# Patient Record
Sex: Male | Born: 1935
Health system: Southern US, Community
[De-identification: ages and names within clinical notes are randomized; demographics above are authoritative.]

## PROBLEM LIST (undated history)

## (undated) DIAGNOSIS — M4800 Spinal stenosis, site unspecified: Secondary | ICD-10-CM

## (undated) DIAGNOSIS — I071 Rheumatic tricuspid insufficiency: Secondary | ICD-10-CM

## (undated) DIAGNOSIS — F329 Major depressive disorder, single episode, unspecified: Secondary | ICD-10-CM

## (undated) DIAGNOSIS — H9190 Unspecified hearing loss, unspecified ear: Secondary | ICD-10-CM

## (undated) DIAGNOSIS — M1990 Unspecified osteoarthritis, unspecified site: Secondary | ICD-10-CM

## (undated) DIAGNOSIS — I495 Sick sinus syndrome: Secondary | ICD-10-CM

## (undated) DIAGNOSIS — I340 Nonrheumatic mitral (valve) insufficiency: Secondary | ICD-10-CM

## (undated) DIAGNOSIS — F32A Depression, unspecified: Secondary | ICD-10-CM

## (undated) DIAGNOSIS — M545 Low back pain, unspecified: Secondary | ICD-10-CM

## (undated) DIAGNOSIS — C44311 Basal cell carcinoma of skin of nose: Secondary | ICD-10-CM

## (undated) DIAGNOSIS — R42 Dizziness and giddiness: Secondary | ICD-10-CM

## (undated) DIAGNOSIS — I10 Essential (primary) hypertension: Secondary | ICD-10-CM

## (undated) DIAGNOSIS — I519 Heart disease, unspecified: Secondary | ICD-10-CM

## (undated) DIAGNOSIS — I351 Nonrheumatic aortic (valve) insufficiency: Secondary | ICD-10-CM

## (undated) DIAGNOSIS — G43909 Migraine, unspecified, not intractable, without status migrainosus: Secondary | ICD-10-CM

## (undated) DIAGNOSIS — M109 Gout, unspecified: Secondary | ICD-10-CM

## (undated) DIAGNOSIS — I4891 Unspecified atrial fibrillation: Secondary | ICD-10-CM

## (undated) DIAGNOSIS — J329 Chronic sinusitis, unspecified: Secondary | ICD-10-CM

## (undated) HISTORY — DX: Gout, unspecified: M10.9

## (undated) HISTORY — PX: NOSE SURGERY: SHX723

## (undated) HISTORY — PX: INSERT / REPLACE / REMOVE PACEMAKER: SUR710

## (undated) HISTORY — DX: Sick sinus syndrome: I49.5

## (undated) HISTORY — DX: Unspecified osteoarthritis, unspecified site: M19.90

## (undated) HISTORY — DX: Dizziness and giddiness: R42

## (undated) HISTORY — DX: Depression, unspecified: F32.A

## (undated) HISTORY — DX: Nonrheumatic mitral (valve) insufficiency: I34.0

## (undated) HISTORY — DX: Major depressive disorder, single episode, unspecified: F32.9

## (undated) HISTORY — DX: Rheumatic tricuspid insufficiency: I07.1

## (undated) HISTORY — PX: TONSILLECTOMY: SUR1361

## (undated) HISTORY — DX: Basal cell carcinoma of skin of nose: C44.311

## (undated) HISTORY — DX: Heart disease, unspecified: I51.9

## (undated) HISTORY — DX: Spinal stenosis, site unspecified: M48.00

## (undated) HISTORY — DX: Migraine, unspecified, not intractable, without status migrainosus: G43.909

## (undated) HISTORY — PX: NASAL SINUS SURGERY: SHX719

## (undated) HISTORY — DX: Nonrheumatic aortic (valve) insufficiency: I35.1

## (undated) HISTORY — DX: Unspecified hearing loss, unspecified ear: H91.90

## (undated) HISTORY — DX: Chronic sinusitis, unspecified: J32.9

## (undated) HISTORY — PX: EP IMPLANTABLE DEVICE: SHX172B

## (undated) HISTORY — DX: Unspecified atrial fibrillation: I48.91

## (undated) HISTORY — DX: Low back pain: M54.5

## (undated) HISTORY — PX: LAMINECTOMY: SHX219

## (undated) HISTORY — PX: TOTAL HIP ARTHROPLASTY: SHX124

## (undated) HISTORY — PX: COLONOSCOPY W/ POLYPECTOMY: SHX1380

## (undated) HISTORY — DX: Low back pain, unspecified: M54.50

## (undated) HISTORY — DX: Essential (primary) hypertension: I10

---

## 2008-02-26 HISTORY — PX: PPM GENERATOR CHANGEOUT: EP1233

## 2015-05-08 DIAGNOSIS — I495 Sick sinus syndrome: Secondary | ICD-10-CM | POA: Diagnosis not present

## 2015-05-22 DIAGNOSIS — M7741 Metatarsalgia, right foot: Secondary | ICD-10-CM | POA: Diagnosis not present

## 2015-05-22 DIAGNOSIS — M2011 Hallux valgus (acquired), right foot: Secondary | ICD-10-CM | POA: Diagnosis not present

## 2015-05-22 DIAGNOSIS — L600 Ingrowing nail: Secondary | ICD-10-CM | POA: Diagnosis not present

## 2015-05-22 DIAGNOSIS — I70213 Atherosclerosis of native arteries of extremities with intermittent claudication, bilateral legs: Secondary | ICD-10-CM | POA: Diagnosis not present

## 2015-05-22 DIAGNOSIS — L84 Corns and callosities: Secondary | ICD-10-CM | POA: Diagnosis not present

## 2015-05-22 DIAGNOSIS — B351 Tinea unguium: Secondary | ICD-10-CM | POA: Diagnosis not present

## 2015-07-31 DIAGNOSIS — M2011 Hallux valgus (acquired), right foot: Secondary | ICD-10-CM | POA: Diagnosis not present

## 2015-07-31 DIAGNOSIS — L600 Ingrowing nail: Secondary | ICD-10-CM | POA: Diagnosis not present

## 2015-07-31 DIAGNOSIS — M7741 Metatarsalgia, right foot: Secondary | ICD-10-CM | POA: Diagnosis not present

## 2015-07-31 DIAGNOSIS — B351 Tinea unguium: Secondary | ICD-10-CM | POA: Diagnosis not present

## 2015-07-31 DIAGNOSIS — L84 Corns and callosities: Secondary | ICD-10-CM | POA: Diagnosis not present

## 2015-07-31 DIAGNOSIS — I70213 Atherosclerosis of native arteries of extremities with intermittent claudication, bilateral legs: Secondary | ICD-10-CM | POA: Diagnosis not present

## 2015-08-05 DIAGNOSIS — N401 Enlarged prostate with lower urinary tract symptoms: Secondary | ICD-10-CM | POA: Diagnosis not present

## 2015-08-21 DIAGNOSIS — I495 Sick sinus syndrome: Secondary | ICD-10-CM | POA: Diagnosis not present

## 2015-09-18 DIAGNOSIS — I480 Paroxysmal atrial fibrillation: Secondary | ICD-10-CM | POA: Diagnosis not present

## 2015-09-18 DIAGNOSIS — Z950 Presence of cardiac pacemaker: Secondary | ICD-10-CM | POA: Diagnosis not present

## 2015-09-18 DIAGNOSIS — I10 Essential (primary) hypertension: Secondary | ICD-10-CM | POA: Diagnosis not present

## 2015-10-09 DIAGNOSIS — L84 Corns and callosities: Secondary | ICD-10-CM | POA: Diagnosis not present

## 2015-10-09 DIAGNOSIS — I70213 Atherosclerosis of native arteries of extremities with intermittent claudication, bilateral legs: Secondary | ICD-10-CM | POA: Diagnosis not present

## 2015-12-18 DIAGNOSIS — I495 Sick sinus syndrome: Secondary | ICD-10-CM | POA: Diagnosis not present

## 2016-03-18 DIAGNOSIS — I495 Sick sinus syndrome: Secondary | ICD-10-CM | POA: Diagnosis not present

## 2016-07-15 DIAGNOSIS — I495 Sick sinus syndrome: Secondary | ICD-10-CM | POA: Diagnosis not present

## 2016-08-03 DIAGNOSIS — R3914 Feeling of incomplete bladder emptying: Secondary | ICD-10-CM | POA: Diagnosis not present

## 2016-10-20 ENCOUNTER — Ambulatory Visit (INDEPENDENT_AMBULATORY_CARE_PROVIDER_SITE_OTHER): Payer: Medicare Other | Admitting: Cardiology

## 2016-10-20 ENCOUNTER — Encounter: Payer: Self-pay | Admitting: Cardiology

## 2016-10-20 VITALS — BP 152/80 | HR 88 | Ht 65.0 in | Wt 170.6 lb

## 2016-10-20 DIAGNOSIS — Z45018 Encounter for adjustment and management of other part of cardiac pacemaker: Secondary | ICD-10-CM | POA: Diagnosis not present

## 2016-10-20 DIAGNOSIS — I442 Atrioventricular block, complete: Secondary | ICD-10-CM | POA: Diagnosis not present

## 2016-10-20 DIAGNOSIS — I480 Paroxysmal atrial fibrillation: Secondary | ICD-10-CM | POA: Diagnosis not present

## 2016-10-20 DIAGNOSIS — I495 Sick sinus syndrome: Secondary | ICD-10-CM | POA: Diagnosis not present

## 2016-10-20 DIAGNOSIS — I10 Essential (primary) hypertension: Secondary | ICD-10-CM

## 2016-10-20 LAB — CUP PACEART INCLINIC DEVICE CHECK
Battery Impedance: 6540 Ohm
Battery Remaining Longevity: 1 mo — CL
Brady Statistic AP VP Percent: 17 %
Brady Statistic AP VS Percent: 0 %
Brady Statistic AS VS Percent: 0 %
Date Time Interrogation Session: 20180626110957
Implantable Lead Implant Date: 19941122
Implantable Lead Location: 753860
Implantable Lead Model: 4024
Implantable Pulse Generator Implant Date: 20091116
Lead Channel Pacing Threshold Amplitude: 0.5 V
Lead Channel Pacing Threshold Amplitude: 0.625 V
Lead Channel Pacing Threshold Amplitude: 1 V
Lead Channel Pacing Threshold Pulse Width: 0.4 ms
Lead Channel Pacing Threshold Pulse Width: 0.4 ms
Lead Channel Pacing Threshold Pulse Width: 0.4 ms
Lead Channel Sensing Intrinsic Amplitude: 4 mV
MDC IDC LEAD IMPLANT DT: 20020426
MDC IDC LEAD LOCATION: 753859
MDC IDC MSMT BATTERY VOLTAGE: 2.59 V
MDC IDC MSMT LEADCHNL RA IMPEDANCE VALUE: 470 Ohm
MDC IDC MSMT LEADCHNL RV IMPEDANCE VALUE: 254 Ohm
MDC IDC MSMT LEADCHNL RV PACING THRESHOLD AMPLITUDE: 0.75 V
MDC IDC MSMT LEADCHNL RV PACING THRESHOLD PULSEWIDTH: 0.4 ms
MDC IDC SET LEADCHNL RA PACING AMPLITUDE: 2 V
MDC IDC SET LEADCHNL RV PACING AMPLITUDE: 2.5 V
MDC IDC SET LEADCHNL RV PACING PULSEWIDTH: 0.4 ms
MDC IDC SET LEADCHNL RV SENSING SENSITIVITY: 11.2 mV
MDC IDC STAT BRADY AS VP PERCENT: 83 %

## 2016-10-20 NOTE — Patient Instructions (Addendum)
Medication Instructions:    Your physician recommends that you continue on your current medications as directed. Please refer to the Current Medication list given to you today.  - If you need a refill on your cardiac medications before your next appointment, please call your pharmacy.   Labwork:  None ordered  Testing/Procedures:  None ordered  Follow-Up:  Your physician recommends that you schedule a follow-up appointment in: one month with device clinic.   Your physician wants you to follow-up in: 6 months with Dr. Curt Bears.  You will receive a reminder letter in the mail two months in advance. If you don't receive a letter, please call our office to schedule the follow-up appointment.  Thank you for choosing [facility]!!   Trinidad Curet, RN [phone]  Any Other Special Instructions Will Be Listed Below (If Applicable).

## 2016-10-20 NOTE — Progress Notes (Signed)
Electrophysiology Office Note   Date:  10/20/2016   ID:  Kwaku, Mostafa 1919-08-08, MRN [id number]  PCP:  Patient, No Pcp Per Primary Electrophysiologist:  Treydon Henricks Meredith Leeds, MD    Chief Complaint  Patient presents with  . Pacemaker Check     History of Present Illness: Uno Esau is a 83 y.o. male who is being seen today for the evaluation of sick sinus syndrome at the request of Constance Haw, MD. Presenting today for electrophysiology evaluation. He has history of hypertension, paroxysmal atrial fibrillation, sick sinus syndrome, and has a pacemaker which was initially implanted on 08/20/2000. He recently relocated to [place] from [place] to be closer to his family.    Today, he denies symptoms of palpitations, chest pain, shortness of breath, orthopnea, PND, lower extremity edema, claudication, dizziness, presyncope, syncope, bleeding, or neurologic sequela. The patient is tolerating medications without difficulties.    Past Medical History:  Diagnosis Date  . A-fib ([place])   . Aortic regurgitation   . Arthritis   . Basal cell carcinoma (BCC) of nostril   . Cardiac disease   . Deaf   . Gout   . Hypertension   . Lumbago   . Migraine headache   . Mitral regurgitation   . Sinusitis   . Spinal stenosis   . SSS (sick sinus syndrome) ([place])   . Tricuspid regurgitation   . Vertigo    Past Surgical History:  Procedure Laterality Date  . COLONOSCOPY W/ POLYPECTOMY    . EP IMPLANTABLE DEVICE     [id number], 2002,  . LAMINECTOMY    . NASAL SINUS SURGERY    . NOSE SURGERY     REMOVAL OF BASAL CELL CA  . PPM GENERATOR CHANGEOUT  02/2008  . TONSILLECTOMY    . TOTAL HIP ARTHROPLASTY Right      Current Outpatient Prescriptions  Medication Sig Dispense Refill  . allopurinol (ZYLOPRIM) 300 MG tablet Take 300 mg by mouth daily.    Marland Kitchen aspirin EC 81 MG tablet Take 81 mg by mouth daily.    . B Complex Vitamins (VITAMIN B COMPLEX PO) Take 1 capsule by mouth  daily.    . Calcium Carbonate-Vitamin D3 (CALCIUM 600-D) 600-400 MG-UNIT TABS Take 1 tablet by mouth 2 (two) times daily.    . finasteride (PROSCAR) 5 MG tablet Take 5 mg by mouth daily.    Marland Kitchen ketotifen (ZADITOR) 0.025 % ophthalmic solution Place 1 drop into both eyes 2 (two) times daily.    . metolazone (ZAROXOLYN) 2.5 MG tablet Take 2.5 mg by mouth daily.    . Multiple Vitamins-Minerals (ICAPS AREDS 2 PO) Take 1 capsule by mouth 2 (two) times daily.     No current facility-administered medications for this visit.     Allergies:   Atenolol; Lisinopril; Nifedipine; Penicillins; Sulfa antibiotics; and Zocor [simvastatin]   Social History:  The patient  reports that he has quit smoking. He has never used smokeless tobacco. He reports that he does not drink alcohol or use drugs.   Family History:  The patient's family history includes Cancer in his mother.    ROS:  Please see the history of present illness.   Otherwise, review of systems is positive for hearing loss, back pain, muscle pain, headaches.   All other systems are reviewed and negative.    PHYSICAL EXAM: VS:  BP (!) 152/80   Pulse 88   Ht 5\' 5"  (1.651 m)   Wt 170 lb 9.6  oz (77.4 kg)   BMI 28.39 kg/m  , BMI Body mass index is 28.39 kg/m. GEN: Well nourished, well developed, in no acute distress  HEENT: normal  Neck: no JVD, carotid bruits, or masses Cardiac: RRR; no murmurs, rubs, or gallops,no edema  Respiratory:  clear to auscultation bilaterally, normal work of breathing GI: soft, nontender, nondistended, + BS MS: no deformity or atrophy  Skin: warm and dry,  device pocket is well healed Neuro:  Strength and sensation are intact Psych: euthymic mood, full affect  EKG:  EKG is ordered today. Personal review of the ekg ordered shows sinus rhythm, V pacing   Device interrogation is reviewed today in detail.  See PaceArt for details.   Recent Labs: No results found for requested labs within last 8760 hours.     Lipid Panel  No results found for: CHOL, TRIG, HDL, CHOLHDL, VLDL, LDLCALC, LDLDIRECT   Wt Readings from Last 3 Encounters:  10/20/16 170 lb 9.6 oz (77.4 kg)      Other studies Reviewed: Additional studies/ records that were reviewed today include: prior cardiology records, TTE 2011  Review of the above records today demonstrates:  Ejection fraction 45-50%, LV in her septum mildly hypokinetic. Normal size left atrium aortic sclerosis present mild to moderate aortic regurgitation. Mild mitral regurgitation. RV systolic pressure 76.1 mmHg.  SPECT 2011 P.m. defect in the inferior wall not reversible consistent with attenuation artifact, ejection fraction 60%  ASSESSMENT AND PLAN:  1.  Atrial fibrillation: Currently in sinus rhythm, and has not had minimal atrial fibrillation on his device check. He has previously not been anticoagulated, per notes from his prior cardiologist, he is at a high fall risk and thus is not anticoagulated.  This patients CHA2DS2-VASc Score and unadjusted Ischemic Stroke Rate (% per year) is equal to 3.2 % stroke rate/year from a score of 3  Above score calculated as 1 point each if present [CHF, HTN, DM, Vascular=MI/PAD/Aortic Plaque, Age if 65-74, or Male] Above score calculated as 2 points each if present [Age > 75, or Stroke/TIA/TE]  2. Sick sinus syndrome: Status post pacemaker implant. Device is nearing ERI. He Sidrah Harden come back to device clinic in one month for a battery check.  3. Hypertension: Elevated today but normal at home. He feels that it is due to white coat hypertension. No changes at this time.     Current medicines are reviewed at length with the patient today.   The patient does not have concerns regarding his medicines.  The following changes were made today:  none  Labs/ tests ordered today include:  Orders Placed This Encounter  Procedures  . EKG 12-Lead     Disposition:   FU with Victorio Creeden 6 months  Signed, Xoe Hoe  Meredith Leeds, MD  10/20/2016 10:48 AM     [facility] [address] [place] [address] [phone] (office) [phone] (fax)

## 2016-11-19 ENCOUNTER — Ambulatory Visit (INDEPENDENT_AMBULATORY_CARE_PROVIDER_SITE_OTHER): Payer: Self-pay | Admitting: *Deleted

## 2016-11-19 DIAGNOSIS — I442 Atrioventricular block, complete: Secondary | ICD-10-CM

## 2016-11-19 LAB — CUP PACEART INCLINIC DEVICE CHECK
Brady Statistic RV Percent Paced: 100 %
Date Time Interrogation Session: 20180726124137
Implantable Lead Implant Date: 20020426
Implantable Lead Location: 753860
Implantable Lead Model: 4024
Implantable Pulse Generator Implant Date: 20091116
Lead Channel Setting Pacing Pulse Width: 0.4 ms
MDC IDC LEAD IMPLANT DT: 19941122
MDC IDC LEAD LOCATION: 753859
MDC IDC MSMT BATTERY IMPEDANCE: 7253 Ohm
MDC IDC MSMT BATTERY VOLTAGE: 2.59 V
MDC IDC MSMT LEADCHNL RA IMPEDANCE VALUE: 67 Ohm
MDC IDC MSMT LEADCHNL RV IMPEDANCE VALUE: 240 Ohm
MDC IDC SET LEADCHNL RV PACING AMPLITUDE: 2.5 V
MDC IDC SET LEADCHNL RV SENSING SENSITIVITY: 11.2 mV

## 2016-11-19 NOTE — Progress Notes (Signed)
Battery check in clinic only (N/C). Device at [facility] since 10/21/16. ROV with WC 12/16/16, gen change scheduled for 12/24/16.

## 2016-11-19 NOTE — Patient Instructions (Addendum)
Medication Instructions:    Your physician recommends that you continue on your current medications as directed. Please refer to the Current Medication list given to you today.  - If you need a refill on your cardiac medications before your next appointment, please call your pharmacy.   Labwork:  None ordered  Testing/Procedures:  Your physician has recommended that you have a pacemaker generator change (battery change).  You will be scheduled for this procedure on 12/24/2016.  We will go over details/instructions at the 8/22 office visit.  Follow-Up:  Your physician recommends that you schedule a follow-up appointment on 12/16/2016 @ 9:45 am with Dr. Curt Bears  Thank you for choosing [facility]!!   Trinidad Curet, RN [phone]

## 2016-11-25 ENCOUNTER — Ambulatory Visit: Payer: Medicare Other | Admitting: Podiatry

## 2016-11-26 ENCOUNTER — Telehealth: Payer: Self-pay | Admitting: Cardiology

## 2016-11-26 NOTE — Telephone Encounter (Signed)
New message       Talk to [facility] again.  Pt forgot to tell her something

## 2016-11-27 ENCOUNTER — Emergency Department (HOSPITAL_COMMUNITY)
Admission: EM | Admit: 2016-11-27 | Discharge: 2016-11-27 | Disposition: A | Discharge status: Home / Self Care | Payer: Medicare Other | Source: Home / Self Care | Attending: Emergency Medicine | Admitting: Emergency Medicine

## 2016-11-27 ENCOUNTER — Encounter (HOSPITAL_COMMUNITY): Payer: Self-pay | Admitting: Nurse Practitioner

## 2016-11-27 DIAGNOSIS — F17200 Nicotine dependence, unspecified, uncomplicated: Secondary | ICD-10-CM | POA: Diagnosis not present

## 2016-11-27 DIAGNOSIS — Z880 Allergy status to penicillin: Secondary | ICD-10-CM | POA: Diagnosis not present

## 2016-11-27 DIAGNOSIS — Z7982 Long term (current) use of aspirin: Secondary | ICD-10-CM | POA: Insufficient documentation

## 2016-11-27 DIAGNOSIS — Z87891 Personal history of nicotine dependence: Secondary | ICD-10-CM | POA: Insufficient documentation

## 2016-11-27 DIAGNOSIS — Z79899 Other long term (current) drug therapy: Secondary | ICD-10-CM | POA: Insufficient documentation

## 2016-11-27 DIAGNOSIS — I10 Essential (primary) hypertension: Secondary | ICD-10-CM

## 2016-11-27 DIAGNOSIS — I5021 Acute systolic (congestive) heart failure: Secondary | ICD-10-CM | POA: Diagnosis not present

## 2016-11-27 DIAGNOSIS — E871 Hypo-osmolality and hyponatremia: Secondary | ICD-10-CM | POA: Diagnosis not present

## 2016-11-27 DIAGNOSIS — I480 Paroxysmal atrial fibrillation: Secondary | ICD-10-CM | POA: Diagnosis not present

## 2016-11-27 DIAGNOSIS — Z85828 Personal history of other malignant neoplasm of skin: Secondary | ICD-10-CM | POA: Diagnosis not present

## 2016-11-27 DIAGNOSIS — Z9181 History of falling: Secondary | ICD-10-CM | POA: Diagnosis not present

## 2016-11-27 DIAGNOSIS — H9190 Unspecified hearing loss, unspecified ear: Secondary | ICD-10-CM | POA: Diagnosis not present

## 2016-11-27 DIAGNOSIS — M4800 Spinal stenosis, site unspecified: Secondary | ICD-10-CM | POA: Diagnosis not present

## 2016-11-27 DIAGNOSIS — I130 Hypertensive heart and chronic kidney disease with heart failure and stage 1 through stage 4 chronic kidney disease, or unspecified chronic kidney disease: Secondary | ICD-10-CM | POA: Diagnosis not present

## 2016-11-27 DIAGNOSIS — K4090 Unilateral inguinal hernia, without obstruction or gangrene, not specified as recurrent: Secondary | ICD-10-CM

## 2016-11-27 DIAGNOSIS — N183 Chronic kidney disease, stage 3 (moderate): Secondary | ICD-10-CM | POA: Diagnosis not present

## 2016-11-27 DIAGNOSIS — I495 Sick sinus syndrome: Secondary | ICD-10-CM | POA: Diagnosis not present

## 2016-11-27 DIAGNOSIS — M109 Gout, unspecified: Secondary | ICD-10-CM | POA: Diagnosis not present

## 2016-11-27 DIAGNOSIS — D649 Anemia, unspecified: Secondary | ICD-10-CM | POA: Diagnosis not present

## 2016-11-27 DIAGNOSIS — I5033 Acute on chronic diastolic (congestive) heart failure: Secondary | ICD-10-CM | POA: Diagnosis not present

## 2016-11-27 DIAGNOSIS — Z96641 Presence of right artificial hip joint: Secondary | ICD-10-CM | POA: Diagnosis not present

## 2016-11-27 DIAGNOSIS — R05 Cough: Secondary | ICD-10-CM | POA: Diagnosis not present

## 2016-11-27 DIAGNOSIS — Z95810 Presence of automatic (implantable) cardiac defibrillator: Secondary | ICD-10-CM | POA: Diagnosis not present

## 2016-11-27 DIAGNOSIS — R0602 Shortness of breath: Secondary | ICD-10-CM | POA: Diagnosis not present

## 2016-11-27 NOTE — ED Notes (Signed)
Ambulated pt with walker no difficulties or other issues.

## 2016-11-27 NOTE — ED Triage Notes (Signed)
Pt presents with c/o right groin pain. The pain began about 6 days ago. The pain is intermittent and does not radiate. He denies any fevers, nausea, vomiting, abdominal pain, dysuria, hematuria, constipation, diarrhea. He has not tried anything for the pain at home. He was told he may have a hernia by the [facility] hospital and scheduled for a CT scan in three weeks but the pain is too severe to wait that long.

## 2016-11-27 NOTE — ED Provider Notes (Signed)
[facility] DEPT Provider Note   CSN: [id number] Arrival date & time: 11/27/16  1104     History   Chief Complaint Chief Complaint  Patient presents with  . Groin Pain    HPI Eugene Miller is a 83 y.o. male presented with left-sided groin pain.  Patient states that about 6 days ago he started to notice some minimal pain in his left groin. Since then, the pain has progressively worsened. He was evaluated at the [facility] yesterday, and told he likely has a hernia. He was scheduled for CT scan 3 weeks out. Patient stated that he cannot wait that long due to the pain, and came here for further evaluation. He reports his pain is present with certain positions, but denies pain at rest. He denies pain anywhere else. He denies recent heavy lifting, straining, or increased activity recently, but was helping pack and move boxes several weeks ago. He denies pain or swelling of the penis or testicles. He denies pain in his abd. He denies fever, chills, nausea, vomiting, urinary sxs, or abnormal BMs.   HPI  Past Medical History:  Diagnosis Date  . A-fib ([place])   . Aortic regurgitation   . Arthritis   . Basal cell carcinoma (BCC) of nostril   . Cardiac disease   . Deaf   . Gout   . Hypertension   . Lumbago   . Migraine headache   . Mitral regurgitation   . Sinusitis   . Spinal stenosis   . SSS (sick sinus syndrome) ([place])   . Tricuspid regurgitation   . Vertigo     Patient Active Problem List   Diagnosis Date Noted  . Paroxysmal atrial fibrillation ([place]) 10/20/2016  . Sick sinus syndrome ([place]) 10/20/2016  . Essential hypertension 10/20/2016    Past Surgical History:  Procedure Laterality Date  . COLONOSCOPY W/ POLYPECTOMY    . EP IMPLANTABLE DEVICE     [id number], 2002,  . LAMINECTOMY    . NASAL SINUS SURGERY    . NOSE SURGERY     REMOVAL OF BASAL CELL CA  . PPM GENERATOR CHANGEOUT  02/2008  . TONSILLECTOMY    . TOTAL HIP ARTHROPLASTY Right        Home Medications     Prior to Admission medications   Medication Sig Start Date End Date Taking? Authorizing Provider  allopurinol (ZYLOPRIM) 300 MG tablet Take 300 mg by mouth daily.   Yes [provider]  aspirin EC 81 MG tablet Take 81 mg by mouth daily.   Yes [provider]  B Complex Vitamins (VITAMIN B COMPLEX PO) Take 1 capsule by mouth daily.   Yes [provider]  Calcium Carbonate-Vitamin D3 (CALCIUM 600-D) 600-400 MG-UNIT TABS Take 1 tablet by mouth 2 (two) times daily.   Yes [provider]  finasteride (PROSCAR) 5 MG tablet Take 5 mg by mouth daily.   Yes [provider]  ketotifen (ZADITOR) 0.025 % ophthalmic solution Place 1 drop into both eyes 2 (two) times daily.   Yes [provider]  metolazone (ZAROXOLYN) 2.5 MG tablet Take 2.5 mg by mouth daily.   Yes [provider]  Multiple Vitamins-Minerals (ICAPS AREDS 2 PO) Take 1 capsule by mouth 2 (two) times daily.   Yes [provider]    Family History Family History  Problem Relation Age of Onset  . Cancer Mother     Social History Social History  Substance Use Topics  . Smoking status: Former Research scientist (life sciences)  .  Smokeless tobacco: Never Used  . Alcohol use No     Allergies   Atenolol; Lisinopril; Nifedipine; Penicillins; Sulfa antibiotics; and Zocor [simvastatin]   Review of Systems Review of Systems  Constitutional: Negative for chills and fever.  Gastrointestinal: Negative for abdominal pain, nausea and vomiting.  Genitourinary: Negative for dysuria, frequency, hematuria, penile pain, penile swelling, scrotal swelling and testicular pain.       L sided groin pain     Physical Exam Updated Vital Signs BP 139/64   Pulse 61   Temp 97.7 F (36.5 C) (Oral)   Resp 16   SpO2 100%   Physical Exam  Constitutional: He is oriented to person, place, and time. He appears well-developed and well-nourished. No distress.  HENT:  Head: Normocephalic and atraumatic.   Eyes: Pupils are equal, round, and reactive to light. EOM are normal.  Neck: Normal range of motion.  Cardiovascular: Normal rate, regular rhythm and intact distal pulses.   Pulmonary/Chest: Effort normal and breath sounds normal. No respiratory distress. He has no wheezes.  Abdominal: Soft. Bowel sounds are normal. He exhibits no distension. There is no tenderness. A hernia is present. Hernia confirmed positive in the left inguinal area.  Genitourinary: Testes normal and penis normal.  Genitourinary Comments: No evidence of strangulation or necrosis of the hernia  Musculoskeletal: Normal range of motion.  Neurological: He is alert and oriented to person, place, and time.  Skin: Skin is warm and dry.  Psychiatric: He has a normal mood and affect.  Nursing note and vitals reviewed.    ED Treatments / Results  Labs (all labs ordered are listed, but only abnormal results are displayed) Labs Reviewed - No data to display  EKG  EKG Interpretation None       Radiology No results found.  Procedures Procedures (including critical care time)  Medications Ordered in ED Medications - No data to display   Initial Impression / Assessment and Plan / ED Course  I have reviewed the triage vital signs and the nursing notes.  Pertinent labs & imaging results that were available during my care of the patient were reviewed by me and considered in my medical decision making (see chart for details).     Pt presenting with worsening L groin pain x6 days. Physical exam showed L inguinal hernia. No signs of incarceration, necrosis, or strangulation. Discussed case with attending, and Dr. Eulis Foster evaluated the pt. Dr. Eulis Foster reduced the hernia. Pt was ambulatory after reduction without return of pain or symptoms. Discussed possible of recurrence, and to avoid increased times of valsalva/straining. Pt to f/u with PCP if sxs return. Return precautions given. Pt and daughter state they understand and  agree to plan.   Final Clinical Impressions(s) / ED Diagnoses   Final diagnoses:  Reducible left inguinal hernia    New Prescriptions Discharge Medication List as of 11/27/2016  1:11 PM       Franchot Heidelberg, PA-C 11/27/16 2128    Daleen Bo, MD 11/28/16 [phone]

## 2016-11-27 NOTE — Discharge Instructions (Signed)
Avoid heavy lifting. Avoid constipation, increase your water and fiber intake. Try not to do any movements that cause increased straining. Follow-up with your primary care doctor if you have return of the pain. You may use Aleve as needed twice a day for soreness and swelling in this area. Return to the emergency department if you develop fever, chills, vomiting, worsening pain, or any new or worsening symptoms.

## 2016-11-28 NOTE — ED Provider Notes (Signed)
  Face-to-face evaluation   History: He c/o left groin knot. No N/V, fever or chills  Physical exam: Left groin mass, medial, 4 X 3 cm.    Hernia reduction Date/Time: 11/28/2016 3:56 PM Performed by: Daleen Bo Authorized by: Daleen Bo  Consent: Verbal consent obtained. Consent given by: patient Patient understanding: patient states understanding of the procedure being performed Patient consent: the patient's understanding of the procedure matches consent given Site marked: the operative site was not marked Imaging studies: imaging studies not available Required items: required blood products, implants, devices, and special equipment available Patient identity confirmed: verbally with patient Local anesthesia used: no  Anesthesia: Local anesthesia used: no  Sedation: Patient sedated: no Patient tolerance: Patient tolerated the procedure well with no immediate complications Comments: Reduced right groin hernia with gentle pressure and left hip flexion. No palpable mass after reduction. Patient also unable to palpate mass after reduction. He was able to walk after, without problem.      Medical screening examination/treatment/procedure(s) were conducted as a shared visit with non-physician practitioner(s) and myself.  I personally evaluated the patient during the encounter   Daleen Bo, MD 11/28/16 1558

## 2016-11-30 ENCOUNTER — Inpatient Hospital Stay (HOSPITAL_COMMUNITY): Payer: Medicare Other

## 2016-11-30 ENCOUNTER — Encounter (HOSPITAL_COMMUNITY): Payer: Self-pay | Admitting: Emergency Medicine

## 2016-11-30 ENCOUNTER — Emergency Department (HOSPITAL_COMMUNITY): Payer: Medicare Other

## 2016-11-30 ENCOUNTER — Encounter (HOSPITAL_COMMUNITY)
Admission: EM | Disposition: A | Discharge status: Home / Self Care | Payer: Self-pay | Source: Home / Self Care | Attending: Internal Medicine

## 2016-11-30 ENCOUNTER — Inpatient Hospital Stay (HOSPITAL_COMMUNITY)
Admission: EM | Admit: 2016-11-30 | Discharge: 2016-12-01 | DRG: 258 | Disposition: A | Discharge status: Home / Self Care | Payer: Medicare Other | Attending: Internal Medicine | Admitting: Internal Medicine

## 2016-11-30 DIAGNOSIS — Z8679 Personal history of other diseases of the circulatory system: Secondary | ICD-10-CM | POA: Diagnosis not present

## 2016-11-30 DIAGNOSIS — Z888 Allergy status to other drugs, medicaments and biological substances status: Secondary | ICD-10-CM | POA: Diagnosis not present

## 2016-11-30 DIAGNOSIS — D649 Anemia, unspecified: Secondary | ICD-10-CM | POA: Diagnosis not present

## 2016-11-30 DIAGNOSIS — I509 Heart failure, unspecified: Secondary | ICD-10-CM

## 2016-11-30 DIAGNOSIS — Z95810 Presence of automatic (implantable) cardiac defibrillator: Secondary | ICD-10-CM

## 2016-11-30 DIAGNOSIS — Z7982 Long term (current) use of aspirin: Secondary | ICD-10-CM

## 2016-11-30 DIAGNOSIS — R609 Edema, unspecified: Secondary | ICD-10-CM | POA: Diagnosis not present

## 2016-11-30 DIAGNOSIS — Z96641 Presence of right artificial hip joint: Secondary | ICD-10-CM | POA: Diagnosis present

## 2016-11-30 DIAGNOSIS — I5043 Acute on chronic combined systolic (congestive) and diastolic (congestive) heart failure: Secondary | ICD-10-CM

## 2016-11-30 DIAGNOSIS — N183 Chronic kidney disease, stage 3 unspecified: Secondary | ICD-10-CM

## 2016-11-30 DIAGNOSIS — M1000 Idiopathic gout, unspecified site: Secondary | ICD-10-CM

## 2016-11-30 DIAGNOSIS — Z87891 Personal history of nicotine dependence: Secondary | ICD-10-CM

## 2016-11-30 DIAGNOSIS — E871 Hypo-osmolality and hyponatremia: Secondary | ICD-10-CM | POA: Diagnosis not present

## 2016-11-30 DIAGNOSIS — Z45010 Encounter for checking and testing of cardiac pacemaker pulse generator [battery]: Secondary | ICD-10-CM | POA: Diagnosis not present

## 2016-11-30 DIAGNOSIS — Z85828 Personal history of other malignant neoplasm of skin: Secondary | ICD-10-CM | POA: Diagnosis not present

## 2016-11-30 DIAGNOSIS — I495 Sick sinus syndrome: Secondary | ICD-10-CM | POA: Diagnosis present

## 2016-11-30 DIAGNOSIS — R069 Unspecified abnormalities of breathing: Secondary | ICD-10-CM | POA: Diagnosis not present

## 2016-11-30 DIAGNOSIS — M4800 Spinal stenosis, site unspecified: Secondary | ICD-10-CM | POA: Diagnosis present

## 2016-11-30 DIAGNOSIS — R05 Cough: Secondary | ICD-10-CM | POA: Diagnosis not present

## 2016-11-30 DIAGNOSIS — I442 Atrioventricular block, complete: Secondary | ICD-10-CM | POA: Diagnosis not present

## 2016-11-30 DIAGNOSIS — Z950 Presence of cardiac pacemaker: Secondary | ICD-10-CM | POA: Diagnosis not present

## 2016-11-30 DIAGNOSIS — R8290 Unspecified abnormal findings in urine: Secondary | ICD-10-CM | POA: Diagnosis not present

## 2016-11-30 DIAGNOSIS — M109 Gout, unspecified: Secondary | ICD-10-CM | POA: Diagnosis present

## 2016-11-30 DIAGNOSIS — N289 Disorder of kidney and ureter, unspecified: Secondary | ICD-10-CM | POA: Diagnosis not present

## 2016-11-30 DIAGNOSIS — I10 Essential (primary) hypertension: Secondary | ICD-10-CM | POA: Diagnosis present

## 2016-11-30 DIAGNOSIS — Z882 Allergy status to sulfonamides status: Secondary | ICD-10-CM | POA: Diagnosis not present

## 2016-11-30 DIAGNOSIS — I130 Hypertensive heart and chronic kidney disease with heart failure and stage 1 through stage 4 chronic kidney disease, or unspecified chronic kidney disease: Secondary | ICD-10-CM | POA: Diagnosis present

## 2016-11-30 DIAGNOSIS — N281 Cyst of kidney, acquired: Secondary | ICD-10-CM | POA: Diagnosis not present

## 2016-11-30 DIAGNOSIS — Z880 Allergy status to penicillin: Secondary | ICD-10-CM | POA: Diagnosis not present

## 2016-11-30 DIAGNOSIS — I361 Nonrheumatic tricuspid (valve) insufficiency: Secondary | ICD-10-CM

## 2016-11-30 DIAGNOSIS — Z9181 History of falling: Secondary | ICD-10-CM | POA: Diagnosis not present

## 2016-11-30 DIAGNOSIS — I5033 Acute on chronic diastolic (congestive) heart failure: Secondary | ICD-10-CM | POA: Diagnosis present

## 2016-11-30 DIAGNOSIS — I5031 Acute diastolic (congestive) heart failure: Secondary | ICD-10-CM | POA: Diagnosis not present

## 2016-11-30 DIAGNOSIS — I480 Paroxysmal atrial fibrillation: Secondary | ICD-10-CM | POA: Diagnosis present

## 2016-11-30 DIAGNOSIS — R0602 Shortness of breath: Secondary | ICD-10-CM | POA: Diagnosis not present

## 2016-11-30 DIAGNOSIS — I5021 Acute systolic (congestive) heart failure: Secondary | ICD-10-CM | POA: Diagnosis not present

## 2016-11-30 DIAGNOSIS — K4090 Unilateral inguinal hernia, without obstruction or gangrene, not specified as recurrent: Secondary | ICD-10-CM | POA: Diagnosis present

## 2016-11-30 DIAGNOSIS — F17200 Nicotine dependence, unspecified, uncomplicated: Secondary | ICD-10-CM | POA: Diagnosis not present

## 2016-11-30 DIAGNOSIS — H9190 Unspecified hearing loss, unspecified ear: Secondary | ICD-10-CM | POA: Diagnosis present

## 2016-11-30 DIAGNOSIS — I5022 Chronic systolic (congestive) heart failure: Secondary | ICD-10-CM

## 2016-11-30 HISTORY — PX: PPM GENERATOR CHANGEOUT: EP1233

## 2016-11-30 LAB — BASIC METABOLIC PANEL
Anion gap: 16 — ABNORMAL HIGH (ref 5–15)
BUN: 25 mg/dL — ABNORMAL HIGH (ref 6–20)
CALCIUM: 9 mg/dL (ref 8.9–10.3)
CHLORIDE: 92 mmol/L — AB (ref 101–111)
CO2: 19 mmol/L — AB (ref 22–32)
CREATININE: 1.73 mg/dL — AB (ref 0.61–1.24)
GFR calc Af Amer: 37 mL/min — ABNORMAL LOW (ref >60)
GFR calc non Af Amer: 32 mL/min — ABNORMAL LOW (ref >60)
GLUCOSE: 140 mg/dL — AB (ref 65–99)
Potassium: 3.5 mmol/L (ref 3.5–5.1)
Sodium: 127 mmol/L — ABNORMAL LOW (ref 135–145)

## 2016-11-30 LAB — CBC WITH DIFFERENTIAL/PLATELET
BASOS PCT: 0 %
Basophils Absolute: 0 10*3/uL (ref 0.0–0.1)
Eosinophils Absolute: 0 10*3/uL (ref 0.0–0.7)
Eosinophils Relative: 1 %
HEMATOCRIT: 35.8 % — AB (ref 39.0–52.0)
HEMOGLOBIN: 12.6 g/dL — AB (ref 13.0–17.0)
LYMPHS ABS: 0.4 10*3/uL — AB (ref 0.7–4.0)
LYMPHS PCT: 8 %
MCH: 31.6 pg (ref 26.0–34.0)
MCHC: 35.2 g/dL (ref 30.0–36.0)
MCV: 89.7 fL (ref 78.0–100.0)
MONO ABS: 0.6 10*3/uL (ref 0.1–1.0)
MONOS PCT: 12 %
NEUTROS ABS: 4.3 10*3/uL (ref 1.7–7.7)
NEUTROS PCT: 79 %
Platelets: 146 10*3/uL — ABNORMAL LOW (ref 150–400)
RBC: 3.99 MIL/uL — ABNORMAL LOW (ref 4.22–5.81)
RDW: 14.8 % (ref 11.5–15.5)
WBC: 5.4 10*3/uL (ref 4.0–10.5)

## 2016-11-30 LAB — I-STAT TROPONIN, ED: Troponin i, poc: 0.02 ng/mL (ref 0.00–0.08)

## 2016-11-30 LAB — OSMOLALITY: Osmolality: 270 mOsm/kg — ABNORMAL LOW (ref 275–295)

## 2016-11-30 LAB — ECHOCARDIOGRAM COMPLETE
CHL CUP TV REG PEAK VELOCITY: 259 cm/s
FS: 26 % — AB (ref 28–44)
IV/PV OW: 1.21
LA diam end sys: 44 mm
LA diam index: 2.38 cm/m2
LA vol index: 36.1 mL/m2
LA vol: 66.8 mL
LASIZE: 44 mm
LAVOLA4C: 60.5 mL
LV dias vol: 54 mL — AB (ref 62–150)
LV sys vol index: 10 mL/m2
LVDIAVOLIN: 29 mL/m2
LVOT SV: 63 mL
LVOT VTI: 24.7 cm
LVOT area: 2.54 cm2
LVOT diameter: 18 mm
LVOT peak grad rest: 7 mmHg
LVOTPV: 133 cm/s
LVSYSVOL: 19 mL — AB (ref 21–61)
PW: 11.2 mm — AB (ref 0.6–1.1)
RV TAPSE: 20.9 mm
RV sys press: 35 mmHg
Simpson's disk: 65
Stroke v: 35 ml
TR max vel: 259 cm/s

## 2016-11-30 LAB — COMPREHENSIVE METABOLIC PANEL
ALBUMIN: 3.8 g/dL (ref 3.5–5.0)
ALK PHOS: 87 U/L (ref 38–126)
ALT: 30 U/L (ref 17–63)
AST: 40 U/L (ref 15–41)
Anion gap: 11 (ref 5–15)
BILIRUBIN TOTAL: 1.1 mg/dL (ref 0.3–1.2)
BUN: 24 mg/dL — ABNORMAL HIGH (ref 6–20)
CALCIUM: 9.1 mg/dL (ref 8.9–10.3)
CO2: 22 mmol/L (ref 22–32)
Chloride: 92 mmol/L — ABNORMAL LOW (ref 101–111)
Creatinine, Ser: 1.62 mg/dL — ABNORMAL HIGH (ref 0.61–1.24)
GFR calc Af Amer: 40 mL/min — ABNORMAL LOW (ref >60)
GFR, EST NON AFRICAN AMERICAN: 34 mL/min — AB (ref >60)
GLUCOSE: 106 mg/dL — AB (ref 65–99)
Potassium: 3.9 mmol/L (ref 3.5–5.1)
Sodium: 125 mmol/L — ABNORMAL LOW (ref 135–145)
TOTAL PROTEIN: 6.8 g/dL (ref 6.5–8.1)

## 2016-11-30 LAB — TROPONIN I: Troponin I: 0.03 ng/mL (ref <0.03)

## 2016-11-30 LAB — IRON AND TIBC
IRON: 34 ug/dL — AB (ref 45–182)
Saturation Ratios: 10 % — ABNORMAL LOW (ref 17.9–39.5)
TIBC: 336 ug/dL (ref 250–450)
UIBC: 302 ug/dL

## 2016-11-30 LAB — TSH
TSH: 3.505 u[IU]/mL (ref 0.350–4.500)
TSH: 3.691 u[IU]/mL (ref 0.350–4.500)

## 2016-11-30 LAB — SODIUM, URINE, RANDOM: SODIUM UR: 103 mmol/L

## 2016-11-30 LAB — VITAMIN B12: Vitamin B-12: 2462 pg/mL — ABNORMAL HIGH (ref 180–914)

## 2016-11-30 LAB — FERRITIN: Ferritin: 67 ng/mL (ref 24–336)

## 2016-11-30 LAB — OSMOLALITY, URINE: Osmolality, Ur: 244 mOsm/kg — ABNORMAL LOW (ref 300–900)

## 2016-11-30 LAB — BRAIN NATRIURETIC PEPTIDE: B NATRIURETIC PEPTIDE 5: 502.6 pg/mL — AB (ref 0.0–100.0)

## 2016-11-30 LAB — CORTISOL: CORTISOL PLASMA: 7.6 ug/dL

## 2016-11-30 SURGERY — PPM GENERATOR CHANGEOUT

## 2016-11-30 MED ORDER — SODIUM CHLORIDE 0.9 % IV SOLN: INTRAVENOUS | Status: DC

## 2016-11-30 MED ORDER — ASPIRIN EC 81 MG PO TBEC
81.0000 mg | DELAYED_RELEASE_TABLET | Freq: Every day | ORAL | Status: DC
  Administered 2016-11-30 – 2016-12-01 (×2): 81 mg via ORAL
  Filled 2016-11-30 (×2): qty 1

## 2016-11-30 MED ORDER — VANCOMYCIN HCL IN DEXTROSE 1-5 GM/200ML-% IV SOLN
INTRAVENOUS | Status: AC
  Filled 2016-11-30: qty 200

## 2016-11-30 MED ORDER — ACETAMINOPHEN 650 MG RE SUPP: 650.0000 mg | Freq: Four times a day (QID) | RECTAL | Status: DC | PRN

## 2016-11-30 MED ORDER — ONDANSETRON HCL 4 MG/2ML IJ SOLN: 4.0000 mg | Freq: Four times a day (QID) | INTRAMUSCULAR | Status: DC | PRN

## 2016-11-30 MED ORDER — SODIUM CHLORIDE 0.9 % IR SOLN
Status: AC
  Filled 2016-11-30: qty 2

## 2016-11-30 MED ORDER — SODIUM CHLORIDE 0.9% FLUSH
3.0000 mL | Freq: Two times a day (BID) | INTRAVENOUS | Status: DC
  Administered 2016-11-30 – 2016-12-01 (×2): 3 mL via INTRAVENOUS

## 2016-11-30 MED ORDER — VANCOMYCIN HCL IN DEXTROSE 1-5 GM/200ML-% IV SOLN
1000.0000 mg | INTRAVENOUS | Status: AC
  Administered 2016-11-30: 1000 mg via INTRAVENOUS

## 2016-11-30 MED ORDER — SODIUM CHLORIDE 0.9 % IR SOLN
80.0000 mg | Status: AC
  Administered 2016-11-30: 80 mg

## 2016-11-30 MED ORDER — LIDOCAINE HCL (PF) 1 % IJ SOLN
INTRAMUSCULAR | Status: AC
  Filled 2016-11-30: qty 60

## 2016-11-30 MED ORDER — SODIUM CHLORIDE 0.9 % IV SOLN: 250.0000 mL | INTRAVENOUS | Status: DC | PRN

## 2016-11-30 MED ORDER — CHLORHEXIDINE GLUCONATE 4 % EX LIQD
60.0000 mL | Freq: Once | CUTANEOUS | Status: DC
  Filled 2016-11-30: qty 60

## 2016-11-30 MED ORDER — FUROSEMIDE 10 MG/ML IJ SOLN
80.0000 mg | Freq: Once | INTRAMUSCULAR | Status: AC
  Administered 2016-11-30: 80 mg via INTRAVENOUS
  Filled 2016-11-30: qty 8

## 2016-11-30 MED ORDER — VANCOMYCIN HCL IN DEXTROSE 1-5 GM/200ML-% IV SOLN
1000.0000 mg | Freq: Two times a day (BID) | INTRAVENOUS | Status: DC
  Filled 2016-11-30: qty 200

## 2016-11-30 MED ORDER — LIDOCAINE HCL (PF) 1 % IJ SOLN
INTRAMUSCULAR | Status: DC | PRN
  Administered 2016-11-30: 45 mL

## 2016-11-30 MED ORDER — FUROSEMIDE 10 MG/ML IJ SOLN: 20.0000 mg | Freq: Every day | INTRAMUSCULAR | Status: DC

## 2016-11-30 MED ORDER — ENOXAPARIN SODIUM 30 MG/0.3ML ~~LOC~~ SOLN: 30.0000 mg | SUBCUTANEOUS | Status: DC

## 2016-11-30 MED ORDER — FINASTERIDE 5 MG PO TABS
5.0000 mg | ORAL_TABLET | Freq: Every day | ORAL | Status: DC
  Administered 2016-11-30 – 2016-12-01 (×2): 5 mg via ORAL
  Filled 2016-11-30 (×2): qty 1

## 2016-11-30 MED ORDER — SODIUM CHLORIDE 0.9% FLUSH: 3.0000 mL | INTRAVENOUS | Status: DC | PRN

## 2016-11-30 MED ORDER — ACETAMINOPHEN 325 MG PO TABS: 325.0000 mg | ORAL_TABLET | ORAL | Status: DC | PRN

## 2016-11-30 MED ORDER — ACETAMINOPHEN 325 MG PO TABS: 650.0000 mg | ORAL_TABLET | Freq: Four times a day (QID) | ORAL | Status: DC | PRN

## 2016-11-30 MED ORDER — MAGNESIUM HYDROXIDE 400 MG/5ML PO SUSP
30.0000 mL | Freq: Once | ORAL | Status: AC
  Administered 2016-11-30: 30 mL via ORAL
  Filled 2016-11-30: qty 30

## 2016-11-30 MED ORDER — FUROSEMIDE 10 MG/ML IJ SOLN
40.0000 mg | Freq: Two times a day (BID) | INTRAMUSCULAR | Status: DC
  Administered 2016-11-30 – 2016-12-01 (×3): 40 mg via INTRAVENOUS
  Filled 2016-11-30 (×3): qty 4

## 2016-11-30 SURGICAL SUPPLY — 7 items
CABLE SURGICAL S-101-97-12 (CABLE) ×3 IMPLANT
DEVICE DISSECT PLASMABLAD 3.0S (MISCELLANEOUS) ×1 IMPLANT
IPG PACE AZUR XT DR MRI W1DR01 (Pacemaker) ×1 IMPLANT
PACE AZURE XT DR MRI W1DR01 (Pacemaker) ×3 IMPLANT
PAD DEFIB LIFELINK (PAD) ×3 IMPLANT
PLASMABLADE 3.0S (MISCELLANEOUS) ×3
TRAY PACEMAKER INSERTION (PACKS) ×3 IMPLANT

## 2016-11-30 NOTE — Consult Note (Signed)
Cardiology Consult    Patient ID: Eugene Miller MRN: [id number], DOB/AGE: [birth year]   Admit date: 11/30/2016 Date of Consult: 11/30/2016  Primary Physician: Patient, No Pcp Per Reason for Consult: CHF Primary Cardiologist: Dr. Curt Bears Requesting Provider: Dr. Allyson Sabal  Patient Profile    Eugene Miller is a 83 y.o. male with past medical history of SSS (s/p Medtronic PPM placement in 2002), PAF (not on anticoagulation secondary to advanced age and fall risk), and HTN who is being seen today for the evaluation of CHF at the request of Dr. Allyson Sabal.   History of Present Illness    He was initially evaluated by Dr. Curt Bears in 05/2016 to establish care as he had recently relocated to [place] from [place]. Device was interrogated in 10/2016 and was at [facility], therefore a gen change was scheduled which is set for 12/24/2016.  He presented to [facility] on [facility]30/2016 from [facility] for worsening dyspnea and lower extremity edema over the past several days. Had been evaluated in the ED 3 days prior for a left groin hernia which was manually reduced but did not report dyspnea at that time.   He reports having worsening shortness of breath for the past several days which has been occurring at rest and with exertion. He ambulates with a rolling walker. Denies any associated chest pain or palpitations. Has noticed orthopnea and lower extremity edema.   Initial labs show WBC of 5.4. Hgb 12.6, platelets 146, Na+ 125, K+ 3.9, creatinine 1.62 (unknown baseline). BNP 502. CXR shows a small right pleural effusion with associated right basilar opacity, which may reflect atelectasis or infiltrate and cardiomegaly with mild perihilar vascular congestion without overt pulmonary edema. EKG shows V-paced, HR 65.  He received IV Lasix 80mg  upon arrival to the ED and an additional 40mg  this AM. Only -200 cc has been recorded for output but he reports significant improvement in his respiratory status thus far.    Past Medical History   Past Medical History:  Diagnosis Date  . A-fib ([place])   . Aortic regurgitation   . Arthritis   . Basal cell carcinoma (BCC) of nostril   . Cardiac disease   . Deaf   . Gout   . Hypertension   . Lumbago   . Migraine headache   . Mitral regurgitation   . Sinusitis   . Spinal stenosis   . SSS (sick sinus syndrome) ([place])   . Tricuspid regurgitation   . Vertigo     Past Surgical History:  Procedure Laterality Date  . COLONOSCOPY W/ POLYPECTOMY    . EP IMPLANTABLE DEVICE     [id number], 2002,  . LAMINECTOMY    . NASAL SINUS SURGERY    . NOSE SURGERY     REMOVAL OF BASAL CELL CA  . PPM GENERATOR CHANGEOUT  02/2008  . TONSILLECTOMY    . TOTAL HIP ARTHROPLASTY Right      Allergies  Allergies  Allergen Reactions  . Atenolol Swelling  . Lisinopril     Doesn't remember  . Nifedipine     Doesn't remember  . Penicillins     Doesn't remember  . Sulfa Antibiotics     Doesn't remember   . Zocor [Simvastatin]     Doesn't remember     Inpatient Medications    . furosemide  40 mg Intravenous BID    Family History    Family History  Problem Relation Age of Onset  . Cancer Mother   .  Prostate cancer Father     Social History    Social History   Social History  . Marital status: Widowed    Spouse name: N/A  . Number of children: N/A  . Years of education: N/A   Occupational History  . Not on file.   Social History Main Topics  . Smoking status: Former Smoker    Types: Cigarettes  . Smokeless tobacco: Never Used  . Alcohol use No  . Drug use: No  . Sexual activity: Not on file   Other Topics Concern  . Not on file   Social History Narrative  . No narrative on file     Review of Systems    General:  No chills, fever, night sweats or weight changes.  Cardiovascular:  No chest pain, palpitations, paroxysmal nocturnal dyspnea. Positive for orthopnea, edema, and dyspnea on exertion.  Dermatological: No rash,  lesions/masses Respiratory: No cough, Positive for dyspnea. Urologic: No hematuria, dysuria Abdominal:   No nausea, vomiting, diarrhea, bright red blood per rectum, melena, or hematemesis Neurologic:  No visual changes, wkns, changes in mental status. All other systems reviewed and are otherwise negative except as noted above.  Physical Exam    Blood pressure (!) 150/71, pulse 64, temperature 98.3 F (36.8 C), temperature source Oral, resp. rate 15, SpO2 95 %.  General: Pleasant elderly Caucasian male appearing in NAD Psych: Normal affect. Neuro: Alert and oriented X 3. Moves all extremities spontaneously. HEENT: Normal  Neck: Supple without bruits. JVD at 9 cm. Lungs:  Resp regular and unlabored, rales at bases bilaterally. Heart: RRR no s3, s4, or murmurs. Abdomen: Soft, non-tender, non-distended, BS + x 4.  Extremities: No clubbing or cyanosis. 2+ pitting edema up to mid-shins bilaterally. DP/PT/Radials 2+ and equal bilaterally.  Labs    Troponin [facility] Test)  Recent Labs  11/30/16 0341  TROPIPOC 0.02   No results for input(s): CKTOTAL, CKMB, TROPONINI in the last 72 hours. Lab Results  Component Value Date   WBC 5.4 11/30/2016   HGB 12.6 (L) 11/30/2016   HCT 35.8 (L) 11/30/2016   MCV 89.7 11/30/2016   PLT 146 (L) 11/30/2016    Recent Labs Lab 11/30/16 0333  NA 125*  K 3.9  CL 92*  CO2 22  BUN 24*  CREATININE 1.62*  CALCIUM 9.1  PROT 6.8  BILITOT 1.1  ALKPHOS 87  ALT 30  AST 40  GLUCOSE 106*   No results found for: CHOL, HDL, LDLCALC, TRIG No results found for: [facility]   Radiology Studies    Dg Chest 2 View  Result Date: 11/30/2016 CLINICAL DATA:  Initial evaluation for acute shortness of breath, cough. EXAM: CHEST  2 VIEW COMPARISON:  None available. FINDINGS: Left-sided pacemaker/AICD in place. Moderate cardiomegaly. Mediastinal silhouette normal. Aortic atherosclerosis. Lungs hypoinflated. Small right pleural effusion. Patchy right basilar  opacity, which may reflect atelectasis or infiltrate. Probable scattered atelectatic changes at the left lung base. Perihilar vascular congestion without pulmonary edema. No pneumothorax. No acute osseus abnormality. IMPRESSION: 1. Small right pleural effusion with associated right basilar opacity, which may reflect atelectasis or infiltrate. 2. Additional streaky left basilar atelectatic changes. 3. Cardiomegaly with mild perihilar vascular congestion without overt pulmonary edema. 4. Aortic atherosclerosis. Electronically Signed   By: Jeannine Boga M.D.   On: 11/30/2016 04:11   US Renal  Result Date: 11/30/2016 CLINICAL DATA:  Renal insufficiency EXAM: RENAL / URINARY TRACT ULTRASOUND COMPLETE COMPARISON:  None. FINDINGS: Right Kidney: Length: 10.1 cm. Mildly  abnormally echogenic parenchyma. No hydronephrosis. 4.7 by 4.6 by 5.5 cm lower pole cyst without visible internal septation or nodularity. Separate right mid kidney 1.0 by 0.8 by 0.9 cm peripheral hypodense lesion questionably with internal echoes. Left Kidney: Length: 10.1 cm. Mildly abnormally echogenic parenchyma. No hydronephrosis. 2.3 by 1.5 by 1.6 cm cyst of the right kidney upper pole, questionable slight complexity along margins. Bladder: Appears normal for degree of bladder distention. Median lobe of the prostate indents the bladder base. IMPRESSION: 1. Bilateral mildly abnormally echogenic renal parenchyma, suggesting chronic medical renal disease. 2. 9 mm in diameter peripheral right mid kidney hypodense lesion has faint internal echoes and could be a small complex cyst but is technically nonspecific. No obvious internal Doppler flow. 3. 2.3 by 1.5 by 1.6 cm left kidney upper pole cystic lesion may have faint marginal complexity but has no visible internal Doppler flow. Electronically Signed   By: Van Clines M.D.   On: 11/30/2016 08:54    EKG & Cardiac Imaging    EKG:  V-paced, HR 65. - Personally Reviewed  Echocardiogram:  11/2009    Assessment & Plan    1. Acute on Chronic Diastolic CHF - patient presents with a 4-day history of worsening dyspnea on exertion, orthopnea, and lower extremity edema. EF at 45-50% by last echo in 2011. - he denies any recent chest pain or palpitations. Consumes a low-sodium diet.  - he does have mild rales on examination and significant lower extremity edema.  - BNP elevated to 502. CXR shows a small right pleural effusion with associated right basilar opacity, which may reflect atelectasis or infiltrate and cardiomegaly with mild perihilar vascular congestion without overt pulmonary edema. Repeat echocardiogram is pending.  - has been started on lV Lasix 40mg  BID. Continue to monitor I&O's along with daily weights. Repeat BMET in AM.   2. SSS - s/p Medtronic PPM placement in 2002 - Followed by Dr. Curt Bears. Scheduled for gen change later this month.   3. PAF - device interrogation in 09/2016 showed < 0.1% AT/AF burden. - This patients CHA2DS2-VASc Score and unadjusted Ischemic Stroke Rate (% per year) is equal to 3.2 % stroke rate/year from a score of 3 (HTN, Age (2)). Not on anticoagulation secondary to advanced age and fall risk.  4. Hyponatremia - Na+ 125 on admission. No prior labs available for comparison.  - continue to follow closely while receiving IV Lasix.   5. Renal Insufficiency - creatinine 1.62 on admission (unknown baseline). - repeat BMET in AM.    Signed, Erma Heritage, PA-C 11/30/2016, 12:40 PM Pager: [phone]   I have seen and examined the patient along with Erma Heritage, PA-C.  I have reviewed the chart, notes and new data.  I agree with PA's note.  Key new complaints: he describes gradual onset of edema and dyspnea on exertion, last night orthopnea. He has been forcing fluids to help with intestinal problems (recent manual reduction of an abdominal hernia) Key examination changes: elevated JVP, 2-3+ pedal edema, clear lungs Key new  findings / data: asynchronous VVI pacing, probably complete heart block - normal sinus rhythm without any ventricular capture on ECG or telemetry. Hyponatremia. Elevated BNP. Creat 1.62 (baseline?). Echo 2011   LVEF 45-50%, mild-mod AI, mild MR, sPAP 36 mm Hg Nuclear stress 2011   LVEF 63%, medium fixed inferior wall defect   PLAN: Suspect CHF decompensation due to asynchronous VVI pacing on background of diastolic dysfunction due to presbycardia. Echo being checked as  we speak. Will try to schedule for generator change soon. Hyponatremia may be related to purposeful increase in fluid intake or CHF. Will d/w Dr. Curt Bears.   Sanda Klein, MD, [place] [phone] 11/30/2016, 1:34 PM

## 2016-11-30 NOTE — Progress Notes (Signed)
Responded to consult to create or update advanced directive for 89-y-o pt in [place] w/ daughter present. Pt just moved here from [facility], [place], a couple of yrs ago to be near daughter and grandchildren. Daughter was concerned there was a specific [place] med form that pt had completed there that may not transfer here that he now needed to re-do in [place].  Upon discussion w/ pt (competent to discuss these matters), it sounded as if form daughter inquired about was an advanced medical portable order for DNR. Informed pt that his doctor could order that here, and he would have to talk to his doctor as to whether such a [place] doctor's order could be transferable to [place]: He should not assume it was, and his doctor would have to say.  Began to explain [facility] substance/process of completing advanced directive here, and daughter thought pt already had done all that as part of his estate planning. Explained this was not a will or for financial matters, but just the healthcare planning. She believed she has a copy of the detailed form pt previously filled out on healthcare matters, will bring it in tomorrow, and determine then if pt needs to do anything else here in [place].  Provided spiritual/emotional support and prayer. Daughter said right now they're attending a [employer].   11/30/16 1200  Clinical Encounter Type  Visited With Patient and family together;Health care provider  Visit Type Initial;Psychological support;Spiritual support;Social support;ED  Referral From Nurse  Spiritual Encounters  Spiritual Needs Brochure;Prayer;Emotional  Stress Factors  Patient Stress Factors Health changes  Family Stress Factors Family relationships;Health changes   Gerrit Heck, Chaplain

## 2016-11-30 NOTE — H&P (Addendum)
TRH H&P   Patient Demographics:    Rayquan Amrhein, is a 83 y.o. male  MRN: [id number]   DOB - [birth year]  Admit Date - 11/30/2016  Outpatient Primary MD for the patient is Patient, No Pcp Per NONE  Referring MD/NP/PA: Scot Jun  Outpatient Specialists: [facility]  Patient coming from: home  Chief Complaint  Patient presents with  . Shortness of Breath  . Leg Swelling      HPI:    Cheng Dec  is a 83 y.o. male, w Afib (Chadsvasc2=2), AR, MR , Cardiiomyopathy (EF 45-50%)  Sick sinus s/p pacer, apparently presents with c/o dyspnea for a couple of weeks that was worse last nite.  Pt has increase in edema.  Pt denies fever, chills, cough, cp, palp, n/v, diarrhea. Pt presented to ED for evaluation.    In ED, CXR=> IMPRESSION: 1. Small right pleural effusion with associated right basilar opacity, which may reflect atelectasis or infiltrate. 2. Additional streaky left basilar atelectatic changes. 3. Cardiomegaly with mild perihilar vascular congestion without overt pulmonary edema. 4. Aortic atherosclerosis.  BNP elevated at 502,  Trop negative.  Hgb 12.6, Plt 146, Na 125, Bun/creat 24/1.62, Glucose 106 Pt will be admitted for hyponatremia, and CHF. Marland Kitchen    Review of systems:    In addition to the HPI above,  No Fever-chills, No Headache, No changes with Vision or hearing, No problems swallowing food or Liquids, No Chest pain, Cough No Abdominal pain, No Nausea or Vommitting, Bowel movements are regular, No Blood in stool or Urine, No dysuria, No new skin rashes or bruises, No new joints pains-aches,  No new weakness, tingling, numbness in any extremity, No recent weight gain or loss, No polyuria, polydypsia or polyphagia, No significant Mental Stressors.  A full 10 point Review of Systems was done, except as stated above, all other Review of Systems were  negative.   With Past History of the following :    Past Medical History:  Diagnosis Date  . A-fib ([place])   . Aortic regurgitation   . Arthritis   . Basal cell carcinoma (BCC) of nostril   . Cardiac disease   . Deaf   . Gout   . Hypertension   . Lumbago   . Migraine headache   . Mitral regurgitation   . Sinusitis   . Spinal stenosis   . SSS (sick sinus syndrome) ([place])   . Tricuspid regurgitation   . Vertigo       Past Surgical History:  Procedure Laterality Date  . COLONOSCOPY W/ POLYPECTOMY    . EP IMPLANTABLE DEVICE     [id number], 2002,  . LAMINECTOMY    . NASAL SINUS SURGERY    . NOSE SURGERY     REMOVAL OF BASAL CELL CA  . PPM GENERATOR CHANGEOUT  02/2008  . TONSILLECTOMY    . TOTAL HIP ARTHROPLASTY  Right       Social History:     Social History  Substance Use Topics  . Smoking status: Former Smoker    Types: Cigarettes  . Smokeless tobacco: Never Used  . Alcohol use No     Lives - at home  Mobility - walks by self    Family History :     Family History  Problem Relation Age of Onset  . Cancer Mother   . Prostate cancer Father      Home Medications:   Prior to Admission medications   Medication Sig Start Date End Date Taking? Authorizing Provider  allopurinol (ZYLOPRIM) 300 MG tablet Take 300 mg by mouth daily.   Yes [provider]  aspirin EC 81 MG tablet Take 81 mg by mouth daily.   Yes [provider]  B Complex Vitamins (VITAMIN B COMPLEX PO) Take 1 capsule by mouth daily.   Yes [provider]  Calcium Carbonate-Vitamin D3 (CALCIUM 600-D) 600-400 MG-UNIT TABS Take 1 tablet by mouth 2 (two) times daily.   Yes [provider]  finasteride (PROSCAR) 5 MG tablet Take 5 mg by mouth daily.   Yes [provider]  ketotifen (ZADITOR) 0.025 % ophthalmic solution Place 1 drop into both eyes 2 (two) times daily.   Yes [provider]  metolazone (ZAROXOLYN) 2.5 MG tablet Take 2.5 mg by  mouth daily.   Yes [provider]  Multiple Vitamins-Minerals (ICAPS AREDS 2 PO) Take 1 capsule by mouth 2 (two) times daily.   Yes [provider]  naproxen sodium (ANAPROX) 220 MG tablet Take 220 mg by mouth 2 (two) times daily as needed (pain).   Yes [provider]     Allergies:     Allergies  Allergen Reactions  . Atenolol Swelling  . Lisinopril     Doesn't remember  . Nifedipine     Doesn't remember  . Penicillins     Doesn't remember  . Sulfa Antibiotics     Doesn't remember   . Zocor [Simvastatin]     Doesn't remember      Physical Exam:   Vitals  Blood pressure (!) 159/70, pulse 65, temperature 98.3 F (36.8 C), temperature source Oral, resp. rate (!) 23, SpO2 96 %.   1. General  lying in bed in NAD,    2. Normal affect and insight, Not Suicidal or Homicidal, Awake Alert, Oriented X 3.  3. No F.N deficits, ALL C.Nerves Intact, Strength 5/5 all 4 extremities, Sensation intact all 4 extremities, Plantars down going.  4. Ears and Eyes appear Normal, Conjunctivae clear, PERRLA. Moist Oral Mucosa.  5. Supple Neck, No JVD, No cervical lymphadenopathy appriciated, No Carotid Bruits.  6. Symmetrical Chest wall movement, Good air movement bilaterally, CTAB.  7. RRR, No Gallops, Rubs or Murmurs, No Parasternal Heave.  8. Positive Bowel Sounds, Abdomen Soft, No tenderness, No organomegaly appriciated,No rebound -guarding or rigidity.  9.  No Cyanosis, Normal Skin Turgor, No Skin Rash or Bruise.  10. Good muscle tone,  joints appear normal , no effusions, Normal ROM.  11. No Palpable Lymph Nodes in Neck or Axillae     Data Review:    CBC  Recent Labs Lab 11/30/16 0333  WBC 5.4  HGB 12.6*  HCT 35.8*  PLT 146*  MCV 89.7  MCH 31.6  MCHC 35.2  RDW 14.8  LYMPHSABS 0.4*  MONOABS 0.6  EOSABS 0.0  BASOSABS 0.0    ------------------------------------------------------------------------------------------------------------------  Chemistries  Recent Labs Lab 11/30/16 0333  NA 125*  K 3.9  CL 92*  CO2 22  GLUCOSE 106*  BUN 24*  CREATININE 1.62*  CALCIUM 9.1  AST 40  ALT 30  ALKPHOS 87  BILITOT 1.1   ------------------------------------------------------------------------------------------------------------------ CrCl cannot be calculated (Unknown ideal weight.). ------------------------------------------------------------------------------------------------------------------ No results for input(s): TSH, T4TOTAL, T3FREE, THYROIDAB in the last 72 hours.  Invalid input(s): FREET3  Coagulation profile No results for input(s): INR, PROTIME in the last 168 hours. ------------------------------------------------------------------------------------------------------------------- No results for input(s): DDIMER in the last 72 hours. -------------------------------------------------------------------------------------------------------------------  Cardiac Enzymes No results for input(s): CKMB, TROPONINI, MYOGLOBIN in the last 168 hours.  Invalid input(s): CK ------------------------------------------------------------------------------------------------------------------    Component Value Date/Time   BNP 502.6 (H) 11/30/2016 0333     ---------------------------------------------------------------------------------------------------------------  Urinalysis No results found for: COLORURINE, APPEARANCEUR, LABSPEC, PHURINE, GLUCOSEU, HGBUR, BILIRUBINUR, KETONESUR, PROTEINUR, UROBILINOGEN, NITRITE, LEUKOCYTESUR  ----------------------------------------------------------------------------------------------------------------   Imaging Results:    Dg Chest 2 View  Result Date: 11/30/2016 CLINICAL DATA:  Initial evaluation for acute shortness of breath, cough. EXAM: CHEST  2 VIEW  COMPARISON:  None available. FINDINGS: Left-sided pacemaker/AICD in place. Moderate cardiomegaly. Mediastinal silhouette normal. Aortic atherosclerosis. Lungs hypoinflated. Small right pleural effusion. Patchy right basilar opacity, which may reflect atelectasis or infiltrate. Probable scattered atelectatic changes at the left lung base. Perihilar vascular congestion without pulmonary edema. No pneumothorax. No acute osseus abnormality. IMPRESSION: 1. Small right pleural effusion with associated right basilar opacity, which may reflect atelectasis or infiltrate. 2. Additional streaky left basilar atelectatic changes. 3. Cardiomegaly with mild perihilar vascular congestion without overt pulmonary edema. 4. Aortic atherosclerosis. Electronically Signed   By: Jeannine Boga M.D.   On: 11/30/2016 04:11      Assessment & Plan:    Principal Problem:   CHF (congestive heart failure) (HCC) Active Problems:   Hyponatremia   Anemia   Renal insufficiency   Edema    Edema/  CHF EF 45% Check TSH Check cardiac echo Lasix 20mg  iv qday  Hyponatremia Check serum osm, tsh, cortisol, urine sodium, urine osm  Renal insufficiency Check urine sodium, urine creatinine, urine eosinophils Renal ultrasound  Anemia Check ferritin, iron/tibc, folate, b12, spep, upep  Gout Allopurinol   Pt is very unclear about his medications, please find his medication.   DVT Prophylaxis Heparin -  SCDs  AM Labs Ordered, also please review Full Orders  Family Communication: Admission, patients condition and plan of care including tests being ordered have been discussed with the patient  who indicate understanding and agree with the plan and Code Status.  Code Status FULL CODE  Likely DC to  home  Condition GUARDED    Consults called: cardiology emailed.   Admission status: inpatient   Time spent in minutes : 45 minutes   Jani Gravel M.D on 11/30/2016 at 7:06 AM  Between 7am to 7pm - Pager -  [phone]. After 7pm go to [email or web address] - password [email or web address]  [facility] - Office  [phone]

## 2016-11-30 NOTE — Progress Notes (Signed)
ED requesting admission for lower ext edema and ? CHF ,  No chest pain.  BNP 502.6,  Trop negative

## 2016-11-30 NOTE — ED Notes (Signed)
Patient transported to X-ray 

## 2016-11-30 NOTE — Consult Note (Signed)
ELECTROPHYSIOLOGY CONSULT NOTE    Patient ID: Eugene Miller MRN: [id number], DOB/AGE: December 27, 1919 83 y.o.  Admit date: 11/30/2016 Date of Consult: 11/30/2016  Primary Physician: Patient, No Pcp Per Electrophysiologist: Meara Wiechman  Reason for Consultation: pacemaker at ERI, pacing at VVI 65  HPI:  Eugene Miller is a 83 y.o. male is referred by Dr Sallyanne Kuster for evaluation of pacemaker at [facility].  Past medical history is significant for SSS, paroxysmal atrial fibrillation (no OAC 2/2 advanced age and fall risk), and hypertension.  He came to the ER with worsening HF symptoms.  He has been diuresed with some improvement. At last office visit 09/2016, he was nearing ERI. He then saw device clinic 10/2016 and was found to be at [facility] with back up VVI 65 pacing. Gen change was scheduled for later this month. He currently denies chest pain, shortness of breath is improved after IV diuresis, LE edema improved after Lasix. Echo being done currently with normal LVEF.  He has not had recent fevers, chills, nausea or vomiting.   Past Medical History:  Diagnosis Date  . A-fib ([place])   . Aortic regurgitation   . Arthritis   . Basal cell carcinoma (BCC) of nostril   . Cardiac disease   . Deaf   . Gout   . Hypertension   . Lumbago   . Migraine headache   . Mitral regurgitation   . Sinusitis   . Spinal stenosis   . SSS (sick sinus syndrome) ([place])   . Tricuspid regurgitation   . Vertigo      Surgical History:  Past Surgical History:  Procedure Laterality Date  . COLONOSCOPY W/ POLYPECTOMY    . EP IMPLANTABLE DEVICE     [id number], 2002,  . LAMINECTOMY    . NASAL SINUS SURGERY    . NOSE SURGERY     REMOVAL OF BASAL CELL CA  . PPM GENERATOR CHANGEOUT  02/2008  . TONSILLECTOMY    . TOTAL HIP ARTHROPLASTY Right       (Not in a hospital admission)  Inpatient Medications: . furosemide  40 mg Intravenous BID    Allergies:  Allergies  Allergen Reactions  . Atenolol Swelling  . Lisinopril    Doesn't remember  . Nifedipine     Doesn't remember  . Penicillins     Doesn't remember  . Sulfa Antibiotics     Doesn't remember   . Zocor [Simvastatin]     Doesn't remember     Social History   Social History  . Marital status: Widowed    Spouse name: N/A  . Number of children: N/A  . Years of education: N/A   Occupational History  . Not on file.   Social History Main Topics  . Smoking status: Former Smoker    Types: Cigarettes  . Smokeless tobacco: Never Used  . Alcohol use No  . Drug use: No  . Sexual activity: Not on file   Other Topics Concern  . Not on file   Social History Narrative  . No narrative on file     Family History  Problem Relation Age of Onset  . Cancer Mother   . Prostate cancer Father      Review of Systems: All other systems reviewed and are otherwise negative except as noted above.  Physical Exam: Vitals:   11/30/16 1030 11/30/16 1100 11/30/16 1130 11/30/16 1200  BP: (!) 168/75 (!) 147/68 (!) 150/71 (!) 150/71  Pulse: 64 67 66 64  Resp:  13 16 (!) 22 15  Temp:      TempSrc:      SpO2: 96% 96% 95% 95%    GEN- The patient is elderly appearing, alert and oriented x 3 today.   HEENT: normocephalic, atraumatic; sclera clear, conjunctiva pink; hearing intact; oropharynx clear; neck supple  Lungs- Clear to ausculation bilaterally, normal work of breathing.  No wheezes, rales, rhonchi Heart- Regular rate and rhythm (paced) GI- soft, non-tender, non-distended, bowel sounds present  Extremities- no clubbing, cyanosis, 1+ BLE edema MS- no significant deformity or atrophy Skin- warm and dry, no rash or lesion Psych- euthymic mood, full affect Neuro- strength and sensation are intact  Labs:  Lab Results  Component Value Date   WBC 5.4 11/30/2016   HGB 12.6 (L) 11/30/2016   HCT 35.8 (L) 11/30/2016   MCV 89.7 11/30/2016   PLT 146 (L) 11/30/2016    Recent Labs Lab 11/30/16 0333  NA 125*  K 3.9  CL 92*  CO2 22  BUN 24*    CREATININE 1.62*  CALCIUM 9.1  PROT 6.8  BILITOT 1.1  ALKPHOS 87  ALT 30  AST 40  GLUCOSE 106*      Radiology/Studies: Dg Chest 2 View  Result Date: 11/30/2016 CLINICAL DATA:  Initial evaluation for acute shortness of breath, cough. EXAM: CHEST  2 VIEW COMPARISON:  None available. FINDINGS: Left-sided pacemaker/AICD in place. Moderate cardiomegaly. Mediastinal silhouette normal. Aortic atherosclerosis. Lungs hypoinflated. Small right pleural effusion. Patchy right basilar opacity, which may reflect atelectasis or infiltrate. Probable scattered atelectatic changes at the left lung base. Perihilar vascular congestion without pulmonary edema. No pneumothorax. No acute osseus abnormality. IMPRESSION: 1. Small right pleural effusion with associated right basilar opacity, which may reflect atelectasis or infiltrate. 2. Additional streaky left basilar atelectatic changes. 3. Cardiomegaly with mild perihilar vascular congestion without overt pulmonary edema. 4. Aortic atherosclerosis. Electronically Signed   By: Jeannine Boga M.D.   On: 11/30/2016 04:11   US Renal  Result Date: 11/30/2016 CLINICAL DATA:  Renal insufficiency EXAM: RENAL / URINARY TRACT ULTRASOUND COMPLETE COMPARISON:  None. FINDINGS: Right Kidney: Length: 10.1 cm. Mildly abnormally echogenic parenchyma. No hydronephrosis. 4.7 by 4.6 by 5.5 cm lower pole cyst without visible internal septation or nodularity. Separate right mid kidney 1.0 by 0.8 by 0.9 cm peripheral hypodense lesion questionably with internal echoes. Left Kidney: Length: 10.1 cm. Mildly abnormally echogenic parenchyma. No hydronephrosis. 2.3 by 1.5 by 1.6 cm cyst of the right kidney upper pole, questionable slight complexity along margins. Bladder: Appears normal for degree of bladder distention. Median lobe of the prostate indents the bladder base. IMPRESSION: 1. Bilateral mildly abnormally echogenic renal parenchyma, suggesting chronic medical renal disease. 2. 9 mm  in diameter peripheral right mid kidney hypodense lesion has faint internal echoes and could be a small complex cyst but is technically nonspecific. No obvious internal Doppler flow. 3. 2.3 by 1.5 by 1.6 cm left kidney upper pole cystic lesion may have faint marginal complexity but has no visible internal Doppler flow. Electronically Signed   By: Van Clines M.D.   On: 11/30/2016 08:54    EKG:V pacing at 65 with AV dissociation (personally reviewed)  TELEMETRY: V pacing at 65, PVC's (personally reviewed)  DEVICE HISTORY: MDT dual chamber PPM implanted 1983 for SSS; gen change 1994, gen change 2002  Assessment/Plan: 1.  SSS with pacemaker at [facility] and symptomatic back up VVI pacing The patient has reached ERI on pacemaker and is symptomatic with VVI pacing. At this  time, it is reasonable to proceed with device generator change. Risks, benefits reviewed with the patient and daughter who wish to proceed.  This Eugene Miller be his 5th device and infection risks are increased, discussed with patient and daughter.  2.  HTN Stable No change required today  3.  Acute diastolic heart failure Due to loss of AV synchrony Should improve post gen change   4.  Paroxysmal atrial fibrillation Burden by device interrogation recently low No OAC 2/2 falls and advanced age CHADS2VASC is 3    Signed, Chanetta Marshall, NP 11/30/2016 2:05 PM  I have seen and examined this patient with Chanetta Marshall.  Agree with above, note added to reflect my findings.  On exam, RRR, no murmurs, lungs clear. Presented to the hospital with fatigue and HF symptoms. Pacemaker went ERI. Plan for generator change. Risks and benefits explained. Risks include but not limited to bleeding and infection. The patient understands the risks and has agreed to the procedure.    Tomeko Scoville M. Delio Slates MD 11/30/2016 2:16 PM

## 2016-11-30 NOTE — Progress Notes (Addendum)
Patient seen and examined  83 y.o. male, w Afib (Chadsvasc2=3 ), AR, MR , Cardiiomyopathy (EF 45-50%)  Sick sinus s/p pacer, apparently presents with c/o dyspnea for a couple of weeks that was worse last nite.  Pt has increase in edema.Pt presenting with worsening L groin pain x6 days. Physical exam showed L inguinal hernia. No signs of incarceration, necrosis, or strangulation.   Dr. Eulis Foster reduced the hernia in the ER on 8/3 . Presented with shortness of breath and 8/6 found to have 3+ pitting edema in bilateral lower extremities, echo in 2011 showed an EF of 45-50% Patient found to have a creatinine of 1.62, baseline unknown Given 80 mg of IV Lasix Admitted to telemetry Repeat 2-D echo  Pending Doppler to rule out DVT  As per cardiology note from 10/20/16 Dr Meredith Leeds He has previously not been anticoagulated, per notes from his prior cardiologist, he is at a high fall risk and thus is not anticoagulated

## 2016-11-30 NOTE — Progress Notes (Signed)
  Echocardiogram 2D Echocardiogram has been performed.  Eugene Miller 11/30/2016, 2:10 PM

## 2016-11-30 NOTE — Progress Notes (Signed)
MD Croitoru aware of pt critical troponin of 0.03, increased from previous level. No new orders. Will continue to monitor pt

## 2016-11-30 NOTE — ED Triage Notes (Signed)
Pt BIB EMS from [facility]. Staff reports pt has had incr'd SOB and swelling to bilat LEs for the past two days. Recently seen for hernia and encouraged to incr fluid intake to help the hernia. Per EMS, fine crackles noted to lower lobes. Pt denies CP, dizziness, or LOC.

## 2016-11-30 NOTE — ED Provider Notes (Signed)
[place] DEPT Provider Note   CSN: [id number] Arrival date & time: 11/30/16  9678     History   Chief Complaint Chief Complaint  Patient presents with  . Shortness of Breath  . Leg Swelling    HPI Eugene Miller is a 83 y.o. male.  Patient presents to the emergency department for evaluation of difficulty breathing. Patient reports that he has been short of breath for the last several days. He has not had any associated chest pain. He does report a cough that is nonproductive. He has not noticed fever.      Past Medical History:  Diagnosis Date  . A-fib ([place])   . Aortic regurgitation   . Arthritis   . Basal cell carcinoma (BCC) of nostril   . Cardiac disease   . Deaf   . Gout   . Hypertension   . Lumbago   . Migraine headache   . Mitral regurgitation   . Sinusitis   . Spinal stenosis   . SSS (sick sinus syndrome) ([place])   . Tricuspid regurgitation   . Vertigo     Patient Active Problem List   Diagnosis Date Noted  . Paroxysmal atrial fibrillation ([place]) 10/20/2016  . Sick sinus syndrome ([place]) 10/20/2016  . Essential hypertension 10/20/2016    Past Surgical History:  Procedure Laterality Date  . COLONOSCOPY W/ POLYPECTOMY    . EP IMPLANTABLE DEVICE     [id number], 2002,  . LAMINECTOMY    . NASAL SINUS SURGERY    . NOSE SURGERY     REMOVAL OF BASAL CELL CA  . PPM GENERATOR CHANGEOUT  02/2008  . TONSILLECTOMY    . TOTAL HIP ARTHROPLASTY Right        Home Medications    Prior to Admission medications   Medication Sig Start Date End Date Taking? Authorizing Provider  allopurinol (ZYLOPRIM) 300 MG tablet Take 300 mg by mouth daily.    [provider]  aspirin EC 81 MG tablet Take 81 mg by mouth daily.    [provider]  B Complex Vitamins (VITAMIN B COMPLEX PO) Take 1 capsule by mouth daily.    [provider]  Calcium Carbonate-Vitamin D3 (CALCIUM 600-D) 600-400 MG-UNIT TABS Take 1 tablet by mouth 2 (two) times daily.     [provider]  finasteride (PROSCAR) 5 MG tablet Take 5 mg by mouth daily.    [provider]  ketotifen (ZADITOR) 0.025 % ophthalmic solution Place 1 drop into both eyes 2 (two) times daily.    [provider]  metolazone (ZAROXOLYN) 2.5 MG tablet Take 2.5 mg by mouth daily.    [provider]  Multiple Vitamins-Minerals (ICAPS AREDS 2 PO) Take 1 capsule by mouth 2 (two) times daily.    [provider]    Family History Family History  Problem Relation Age of Onset  . Cancer Mother     Social History Social History  Substance Use Topics  . Smoking status: Former Research scientist (life sciences)  . Smokeless tobacco: Never Used  . Alcohol use No     Allergies   Atenolol; Lisinopril; Nifedipine; Penicillins; Sulfa antibiotics; and Zocor [simvastatin]   Review of Systems Review of Systems  Respiratory: Positive for shortness of breath.   All other systems reviewed and are negative.    Physical Exam Updated Vital Signs BP (!) 159/70   Pulse 65   Temp 98.3 F (36.8 C) (Oral)   Resp (!) 23   SpO2 96%  Physical Exam  Constitutional: He is oriented to person, place, and time. He appears well-developed and well-nourished. No distress.  HENT:  Head: Normocephalic and atraumatic.  Right Ear: Hearing normal.  Left Ear: Hearing normal.  Nose: Nose normal.  Mouth/Throat: Oropharynx is clear and moist and mucous membranes are normal.  Eyes: Pupils are equal, round, and reactive to light. Conjunctivae and EOM are normal.  Neck: Normal range of motion. Neck supple.  Cardiovascular: Regular rhythm, S1 normal and S2 normal.  Exam reveals no gallop and no friction rub.   No murmur heard. Pulmonary/Chest: Effort normal and breath sounds normal. No respiratory distress. He exhibits no tenderness.  Abdominal: Soft. Normal appearance and bowel sounds are normal. There is no hepatosplenomegaly. There is no tenderness. There is no rebound, no guarding, no  tenderness at McBurney's point and negative Murphy's sign. No hernia.  Musculoskeletal: Normal range of motion. He exhibits edema (3+).  Neurological: He is alert and oriented to person, place, and time. He has normal strength. No cranial nerve deficit or sensory deficit. Coordination normal. GCS eye subscore is 4. GCS verbal subscore is 5. GCS motor subscore is 6.  Skin: Skin is warm, dry and intact. No rash noted. No cyanosis.  Psychiatric: He has a normal mood and affect. His speech is normal and behavior is normal. Thought content normal.  Nursing note and vitals reviewed.    ED Treatments / Results  Labs (all labs ordered are listed, but only abnormal results are displayed) Labs Reviewed  CBC WITH DIFFERENTIAL/PLATELET - Abnormal; Notable for the following:       Result Value   RBC 3.99 (*)    Hemoglobin 12.6 (*)    HCT 35.8 (*)    Platelets 146 (*)    Lymphs Abs 0.4 (*)    All other components within normal limits  COMPREHENSIVE METABOLIC PANEL - Abnormal; Notable for the following:    Sodium 125 (*)    Chloride 92 (*)    Glucose, Bld 106 (*)    BUN 24 (*)    Creatinine, Ser 1.62 (*)    GFR calc non Af Amer 34 (*)    GFR calc Af Amer 40 (*)    All other components within normal limits  BRAIN NATRIURETIC PEPTIDE - Abnormal; Notable for the following:    B Natriuretic Peptide 502.6 (*)    All other components within normal limits  I-STAT TROPONIN, ED    EKG  EKG Interpretation  Date/Time:  Monday November 30 2016 03:32:17 EDT Ventricular Rate:  65 PR Interval:    QRS Duration: 178 QT Interval:  468 QTC Calculation: 486 R Axis:   124 Text Interpretation:  VENTRICULAR PACED RHYTHM Confirmed by Orpah Greek [phone]) on 11/30/2016 3:36:37 AM       Radiology Dg Chest 2 View  Result Date: 11/30/2016 CLINICAL DATA:  Initial evaluation for acute shortness of breath, cough. EXAM: CHEST  2 VIEW COMPARISON:  None available. FINDINGS: Left-sided pacemaker/AICD in  place. Moderate cardiomegaly. Mediastinal silhouette normal. Aortic atherosclerosis. Lungs hypoinflated. Small right pleural effusion. Patchy right basilar opacity, which may reflect atelectasis or infiltrate. Probable scattered atelectatic changes at the left lung base. Perihilar vascular congestion without pulmonary edema. No pneumothorax. No acute osseus abnormality. IMPRESSION: 1. Small right pleural effusion with associated right basilar opacity, which may reflect atelectasis or infiltrate. 2. Additional streaky left basilar atelectatic changes. 3. Cardiomegaly with mild perihilar vascular congestion without overt pulmonary edema. 4. Aortic atherosclerosis. Electronically Signed  By: Jeannine Boga M.D.   On: 11/30/2016 04:11    Procedures Procedures (including critical care time)  Medications Ordered in ED Medications  furosemide (LASIX) injection 80 mg (80 mg Intravenous Given 11/30/16 7672)     Initial Impression / Assessment and Plan / ED Course  I have reviewed the triage vital signs and the nursing notes.  Pertinent labs & imaging results that were available during my care of the patient were reviewed by me and considered in my medical decision making (see chart for details).     Patient presents to the emergency department for evaluation of shortness of breath. Patient reports that he has been experiencing increased swelling of his legs and shortness of breath for the last 2 days. He normally goes to the [place]. Patient reports that he was seen at the [place] this past week, told him about his legs but nothing was changed.  Records reveal that he did have an echo performed in 2011 that showed ejection fraction of 45-50% with multiple valve abnormalities. He has not had any studies since. Patient's current evaluation is consistent with congestive heart failure exacerbation. He was given Lasix 80 mg IV, records do not indicate current Lasix use. Patient appears to be significantly volume  overloaded, will require hospitalization for further treatment.  Final Clinical Impressions(s) / ED Diagnoses   Final diagnoses:  Congestive heart failure, unspecified HF chronicity, unspecified heart failure type [facility])    New Prescriptions New Prescriptions   No medications on file     Orpah Greek, MD 11/30/16 [phone]

## 2016-12-01 ENCOUNTER — Inpatient Hospital Stay (HOSPITAL_COMMUNITY): Payer: Medicare Other

## 2016-12-01 ENCOUNTER — Encounter (HOSPITAL_COMMUNITY): Payer: Self-pay | Admitting: Cardiology

## 2016-12-01 DIAGNOSIS — R609 Edema, unspecified: Secondary | ICD-10-CM

## 2016-12-01 DIAGNOSIS — N183 Chronic kidney disease, stage 3 (moderate): Secondary | ICD-10-CM

## 2016-12-01 DIAGNOSIS — I5033 Acute on chronic diastolic (congestive) heart failure: Secondary | ICD-10-CM

## 2016-12-01 DIAGNOSIS — I5031 Acute diastolic (congestive) heart failure: Secondary | ICD-10-CM

## 2016-12-01 DIAGNOSIS — I10 Essential (primary) hypertension: Secondary | ICD-10-CM

## 2016-12-01 LAB — COMPREHENSIVE METABOLIC PANEL
ALBUMIN: 3.5 g/dL (ref 3.5–5.0)
ALK PHOS: 78 U/L (ref 38–126)
ALT: 27 U/L (ref 17–63)
ANION GAP: 11 (ref 5–15)
AST: 38 U/L (ref 15–41)
BUN: 25 mg/dL — ABNORMAL HIGH (ref 6–20)
CALCIUM: 9.2 mg/dL (ref 8.9–10.3)
CO2: 30 mmol/L (ref 22–32)
Chloride: 89 mmol/L — ABNORMAL LOW (ref 101–111)
Creatinine, Ser: 1.76 mg/dL — ABNORMAL HIGH (ref 0.61–1.24)
GFR calc non Af Amer: 31 mL/min — ABNORMAL LOW (ref >60)
GFR, EST AFRICAN AMERICAN: 36 mL/min — AB (ref >60)
Glucose, Bld: 112 mg/dL — ABNORMAL HIGH (ref 65–99)
Potassium: 3.5 mmol/L (ref 3.5–5.1)
SODIUM: 130 mmol/L — AB (ref 135–145)
TOTAL PROTEIN: 6 g/dL — AB (ref 6.5–8.1)
Total Bilirubin: 1.2 mg/dL (ref 0.3–1.2)

## 2016-12-01 LAB — PROTEIN ELECTROPHORESIS, SERUM
A/G RATIO SPE: 1.2 (ref 0.7–1.7)
ALBUMIN ELP: 3.6 g/dL (ref 2.9–4.4)
Alpha-1-Globulin: 0.3 g/dL (ref 0.0–0.4)
Alpha-2-Globulin: 0.6 g/dL (ref 0.4–1.0)
Beta Globulin: 1 g/dL (ref 0.7–1.3)
GAMMA GLOBULIN: 1.1 g/dL (ref 0.4–1.8)
Globulin, Total: 3.1 g/dL (ref 2.2–3.9)
TOTAL PROTEIN ELP: 6.7 g/dL (ref 6.0–8.5)

## 2016-12-01 LAB — CBC
HCT: 36.2 % — ABNORMAL LOW (ref 39.0–52.0)
HEMOGLOBIN: 12.6 g/dL — AB (ref 13.0–17.0)
MCH: 31.1 pg (ref 26.0–34.0)
MCHC: 34.8 g/dL (ref 30.0–36.0)
MCV: 89.4 fL (ref 78.0–100.0)
Platelets: 178 10*3/uL (ref 150–400)
RBC: 4.05 MIL/uL — AB (ref 4.22–5.81)
RDW: 14.9 % (ref 11.5–15.5)
WBC: 8 10*3/uL (ref 4.0–10.5)

## 2016-12-01 LAB — FOLATE RBC
FOLATE, RBC: 1217 ng/mL (ref >498)
Folate, Hemolysate: 462.5 ng/mL
HEMATOCRIT: 38 % (ref 37.5–51.0)

## 2016-12-01 LAB — CALCIUM / CREATININE RATIO, URINE
CREATININE, UR: 9.4 mg/dL
Calcium, Ur: 3.9 mg/dL
Calcium/Creat.Ratio: 415 mg/g creat — ABNORMAL HIGH (ref 0–260)

## 2016-12-01 LAB — TROPONIN I
Troponin I: 0.04 ng/mL (ref <0.03)
Troponin I: 0.05 ng/mL (ref <0.03)

## 2016-12-01 MED ORDER — IOPAMIDOL (ISOVUE-300) INJECTION 61%
INTRAVENOUS | Status: AC
  Filled 2016-12-01: qty 30

## 2016-12-01 MED ORDER — IOPAMIDOL (ISOVUE-300) INJECTION 61%
INTRAVENOUS | Status: AC
  Administered 2016-12-01: 80 mL
  Filled 2016-12-01: qty 100

## 2016-12-01 MED ORDER — FUROSEMIDE 40 MG PO TABS
40.0000 mg | ORAL_TABLET | Freq: Every day | ORAL | Status: DC
Start: 2016-12-01 — End: 2016-12-01

## 2016-12-01 MED ORDER — FUROSEMIDE 40 MG PO TABS: 40.0000 mg | ORAL_TABLET | Freq: Every day | ORAL | 1 refills | Status: DC

## 2016-12-01 MED ORDER — IOPAMIDOL (ISOVUE-300) INJECTION 61%
15.0000 mL | INTRAVENOUS | Status: AC
  Administered 2016-12-01: 15 mL via ORAL

## 2016-12-01 MED ORDER — OXYMETAZOLINE HCL 0.05 % NA SOLN
2.0000 | Freq: Two times a day (BID) | NASAL | Status: DC
  Administered 2016-12-01: 2 via NASAL
  Filled 2016-12-01: qty 15

## 2016-12-01 NOTE — Progress Notes (Signed)
Patient's daughter expressed concern regarding the hiatal hernia that she says the patient has. Daughter states she would like it to be re-evaluated. MD made aware. Order given for abdominal CT.

## 2016-12-01 NOTE — Care Management Note (Signed)
Case Management Note  Patient Details  Name: Tajai Ihde MRN: [id number] Date of Birth: [birth year]  Subjective/Objective:                 Spoke to patient in room. Patient [facility] at [facility]. He gets his medications filled there. Lives at home alone. His daughter checks on him frequently. Has rollator from home at bedside.  [phone] [facility] [place] Billing [phone] ext [phone]   Action/Plan:   Expected Discharge Date:                  Expected Discharge Plan:  Home/Self Care  In-House Referral:     Discharge planning Services  CM Consult  Post Acute Care Choice:    Choice offered to:     DME Arranged:    DME Agency:     HH Arranged:    HH Agency:     Status of Service:  In process, will continue to follow  If discussed at Long Length of Stay Meetings, dates discussed:    Additional Comments:  Carles Collet, RN 12/01/2016, 3:38 PM

## 2016-12-01 NOTE — Progress Notes (Signed)
Pt has orders to be discharged. Discharge instructions given and pt has no additional questions at this time. Medication regimen reviewed and pt educated. Pt verbalized understanding and has no additional questions. Telemetry box removed. IV removed and site in good condition. Pt stable and waiting for transportation. 

## 2016-12-01 NOTE — Progress Notes (Addendum)
Progress Note  Patient Name: Eugene Miller Date of Encounter: 12/01/2016  Primary Cardiologist: Dr Curt Bears  Subjective   Dyspnea resolved; no chest pain.  Inpatient Medications    Scheduled Meds: . aspirin EC  81 mg Oral Daily  . enoxaparin (LOVENOX) injection  30 mg Subcutaneous Q24H  . finasteride  5 mg Oral Daily  . furosemide  40 mg Intravenous BID  . sodium chloride flush  3 mL Intravenous Q12H   Continuous Infusions: . sodium chloride    . vancomycin     PRN Meds: sodium chloride, acetaminophen **OR** acetaminophen, ondansetron (ZOFRAN) IV, sodium chloride flush   Vital Signs    Vitals:   11/30/16 1815 11/30/16 2040 12/01/16 0029 12/01/16 0733  BP: (!) 152/71 123/73 (!) 147/70 (!) 150/53  Pulse:  78 71 65  Resp:  20 20 18   Temp:  97.7 F (36.5 C) 97.9 F (36.6 C) (!) 97.4 F (36.3 C)  TempSrc:  Oral Oral Oral  SpO2:  93% 94% 94%  Weight:    75.6 kg (166 lb 11.2 oz)  Height:        Intake/Output Summary (Last 24 hours) at 12/01/16 0919 Last data filed at 12/01/16 0034  Gross per 24 hour  Intake              480 ml  Output             1500 ml  Net            -1020 ml   Filed Weights   11/30/16 1646 12/01/16 0733  Weight: 76.8 kg (169 lb 4.8 oz) 75.6 kg (166 lb 11.2 oz)    Telemetry    Ventricular pacing - Personally Reviewed   Physical Exam   GEN: No acute distress.   Neck: No JVD Cardiac: RRR, no murmurs, rubs, or gallops.  Respiratory: Mildly diminished BS bases; pacemaker site with no hematoma and no drainage GI: Soft, nontender, non-distended  MS: trace edema Neuro:  Nonfocal  Psych: Normal affect   Labs    Chemistry Recent Labs Lab 11/30/16 0333 11/30/16 1703 12/01/16 0505  NA 125* 127* 130*  K 3.9 3.5 3.5  CL 92* 92* 89*  CO2 22 19* 30  GLUCOSE 106* 140* 112*  BUN 24* 25* 25*  CREATININE 1.62* 1.73* 1.76*  CALCIUM 9.1 9.0 9.2  PROT 6.8  --  6.0*  ALBUMIN 3.8  --  3.5  AST 40  --  38  ALT 30  --  27  ALKPHOS 87   --  78  BILITOT 1.1  --  1.2  GFRNONAA 34* 32* 31*  GFRAA 40* 37* 36*  ANIONGAP 11 16* 11     Hematology Recent Labs Lab 11/30/16 0333 12/01/16 0505  WBC 5.4 8.0  RBC 3.99* 4.05*  HGB 12.6* 12.6*  HCT 35.8* 36.2*  MCV 89.7 89.4  MCH 31.6 31.1  MCHC 35.2 34.8  RDW 14.8 14.9  PLT 146* 178    Cardiac Enzymes Recent Labs Lab 11/30/16 1703 11/30/16 2329 12/01/16 0505  TROPONINI 0.03* 0.04* 0.05*    Recent Labs Lab 11/30/16 0341  TROPIPOC 0.02     BNP Recent Labs Lab 11/30/16 0333  BNP 502.6*       Radiology    Dg Chest 2 View  Result Date: 11/30/2016 CLINICAL DATA:  Initial evaluation for acute shortness of breath, cough. EXAM: CHEST  2 VIEW COMPARISON:  None available. FINDINGS: Left-sided pacemaker/AICD in place. Moderate cardiomegaly. Mediastinal  silhouette normal. Aortic atherosclerosis. Lungs hypoinflated. Small right pleural effusion. Patchy right basilar opacity, which may reflect atelectasis or infiltrate. Probable scattered atelectatic changes at the left lung base. Perihilar vascular congestion without pulmonary edema. No pneumothorax. No acute osseus abnormality. IMPRESSION: 1. Small right pleural effusion with associated right basilar opacity, which may reflect atelectasis or infiltrate. 2. Additional streaky left basilar atelectatic changes. 3. Cardiomegaly with mild perihilar vascular congestion without overt pulmonary edema. 4. Aortic atherosclerosis. Electronically Signed   By: Jeannine Boga M.D.   On: 11/30/2016 04:11   US Renal  Result Date: 11/30/2016 CLINICAL DATA:  Renal insufficiency EXAM: RENAL / URINARY TRACT ULTRASOUND COMPLETE COMPARISON:  None. FINDINGS: Right Kidney: Length: 10.1 cm. Mildly abnormally echogenic parenchyma. No hydronephrosis. 4.7 by 4.6 by 5.5 cm lower pole cyst without visible internal septation or nodularity. Separate right mid kidney 1.0 by 0.8 by 0.9 cm peripheral hypodense lesion questionably with internal echoes.  Left Kidney: Length: 10.1 cm. Mildly abnormally echogenic parenchyma. No hydronephrosis. 2.3 by 1.5 by 1.6 cm cyst of the right kidney upper pole, questionable slight complexity along margins. Bladder: Appears normal for degree of bladder distention. Median lobe of the prostate indents the bladder base. IMPRESSION: 1. Bilateral mildly abnormally echogenic renal parenchyma, suggesting chronic medical renal disease. 2. 9 mm in diameter peripheral right mid kidney hypodense lesion has faint internal echoes and could be a small complex cyst but is technically nonspecific. No obvious internal Doppler flow. 3. 2.3 by 1.5 by 1.6 cm left kidney upper pole cystic lesion may have faint marginal complexity but has no visible internal Doppler flow. Electronically Signed   By: Van Clines M.D.   On: 11/30/2016 08:54    Patient Profile     83 y.o. male with past medical history of permanent pacemaker, hypertension, paroxysmal atrial fibrillation admitted with acute diastolic congestive heart failure and pacemaker ERI.   Assessment & Plan    1 acute diastolic congestive heart failure-patient's symptoms have improved. He diuresed 1 L over last 24 hours. I will change Lasix to 40 mg by mouth daily. He should take an additional 40 mg for weight gain of 2-3 pounds. I would not resume metolazone. Would check potassium and renal function one week following discharge. Patient instructed on fluid restriction and low sodium diet.  2 status post generator change-pacemaker site with no hematoma. He will need follow-up with Dr. Curt Bears and EP clinic.   3 paroxysmal atrial fibrillation-patient is not on anticoagulation because of age and fall risk by report.  4 chronic stage III kidney disease-check potassium and renal function one week after discharge.  5 hyponatremia-sodium is improving. Follow-up as outpatient.  Patient can be discharged from a cardiac standpoint. Follow-up for pacemaker check and bmet in one week;  FU Dr Curt Bears.  Signed, Kirk Ruths, MD  12/01/2016, 9:19 AM

## 2016-12-01 NOTE — Progress Notes (Signed)
VASCULAR LAB PRELIMINARY  PRELIMINARY  PRELIMINARY  PRELIMINARY   Bilateral lower extremity venous duplex completed.    Preliminary report: Bilateral:  No evidence of DVT, superficial thrombosis, or Baker's Cyst. Mild to moderate interstitial fluid noted in the lower leg   [employer], [place], RVS 12/01/2016, 5:24 PM

## 2016-12-01 NOTE — Discharge Summary (Addendum)
Physician Discharge Summary  Eugene Miller MRN: [id number] DOB/AGE: [birth year] 81 y.o.  PCP: Patient, No Pcp Per   Admit date: 11/30/2016 Discharge date: 12/01/2016  Discharge Diagnoses:    Principal Problem:   CHF exacerbation ([place]) Active Problems:   Paroxysmal atrial fibrillation ([facility])   Sick sinus syndrome ([place])   Essential hypertension   Hyponatremia   Anemia   Renal insufficiency   Edema   Gout   History of sick sinus syndrome   CHF (congestive heart failure) ([facility])    Follow-up recommendations Follow-up with PCP in 3-5 days , including all  additional recommended appointments as below Follow-up CBC, CMP in 3-5 days Patient will need follow up CT abdomen in 12 months ,results discussed with daughter      Current Discharge Medication List    START taking these medications   Details  furosemide (LASIX) 40 MG tablet Take 1 tablet (40 mg total) by mouth daily. Qty: 30 tablet, Refills: 1      CONTINUE these medications which have NOT CHANGED   Details  allopurinol (ZYLOPRIM) 300 MG tablet Take 300 mg by mouth daily.    aspirin EC 81 MG tablet Take 81 mg by mouth daily.    B Complex Vitamins (VITAMIN B COMPLEX PO) Take 1 capsule by mouth daily.    Calcium Carbonate-Vitamin D3 (CALCIUM 600-D) 600-400 MG-UNIT TABS Take 1 tablet by mouth 2 (two) times daily.    finasteride (PROSCAR) 5 MG tablet Take 5 mg by mouth daily.    ketotifen (ZADITOR) 0.025 % ophthalmic solution Place 1 drop into both eyes 2 (two) times daily.    Multiple Vitamins-Minerals (ICAPS AREDS 2 PO) Take 1 capsule by mouth 2 (two) times daily.      STOP taking these medications     metolazone (ZAROXOLYN) 2.5 MG tablet      naproxen sodium (ANAPROX) 220 MG tablet          Discharge Condition: Stable *  Discharge Instructions Get Medicines reviewed and adjusted: Please take all your medications with you for your next visit with your Primary MD  Please request your Primary MD to go  over all hospital tests and procedure/radiological results at the follow up, please ask your Primary MD to get all Hospital records sent to his/her office.  If you experience worsening of your admission symptoms, develop shortness of breath, life threatening emergency, suicidal or homicidal thoughts you must seek medical attention immediately by calling 911 or calling your MD immediately if symptoms less severe.  You must read complete instructions/literature along with all the possible adverse reactions/side effects for all the Medicines you take and that have been prescribed to you. Take any new Medicines after you have completely understood and accpet all the possible adverse reactions/side effects.   Do not drive when taking Pain medications.   Do not take more than prescribed Pain, Sleep and Anxiety Medications  Special Instructions: If you have smoked or chewed Tobacco in the last 2 yrs please stop smoking, stop any regular Alcohol and or any Recreational drug use.  Wear Seat belts while driving.  Please note  You were cared for by a hospitalist during your hospital stay. Once you are discharged, your primary care physician will handle any further medical issues. Please note that NO REFILLS for any discharge medications will be authorized once you are discharged, as it is imperative that you return to your primary care physician (or establish a relationship with a primary care physician if you  do not have one) for your aftercare needs so that they can reassess your need for medications and monitor your lab values.     Allergies  Allergen Reactions  . Atenolol Swelling  . Lisinopril     Doesn't remember  . Nifedipine     Doesn't remember  . Penicillins     Doesn't remember  . Sulfa Antibiotics     Doesn't remember   . Zocor [Simvastatin]     Doesn't remember       Disposition: 01-Home or Self Care   Consults:  Cardiology ED     Significant Diagnostic  Studies:  Dg Chest 2 View  Result Date: 11/30/2016 CLINICAL DATA:  Initial evaluation for acute shortness of breath, cough. EXAM: CHEST  2 VIEW COMPARISON:  None available. FINDINGS: Left-sided pacemaker/AICD in place. Moderate cardiomegaly. Mediastinal silhouette normal. Aortic atherosclerosis. Lungs hypoinflated. Small right pleural effusion. Patchy right basilar opacity, which may reflect atelectasis or infiltrate. Probable scattered atelectatic changes at the left lung base. Perihilar vascular congestion without pulmonary edema. No pneumothorax. No acute osseus abnormality. IMPRESSION: 1. Small right pleural effusion with associated right basilar opacity, which may reflect atelectasis or infiltrate. 2. Additional streaky left basilar atelectatic changes. 3. Cardiomegaly with mild perihilar vascular congestion without overt pulmonary edema. 4. Aortic atherosclerosis. Electronically Signed   By: Jeannine Boga M.D.   On: 11/30/2016 04:11   US Renal  Result Date: 11/30/2016 CLINICAL DATA:  Renal insufficiency EXAM: RENAL / URINARY TRACT ULTRASOUND COMPLETE COMPARISON:  None. FINDINGS: Right Kidney: Length: 10.1 cm. Mildly abnormally echogenic parenchyma. No hydronephrosis. 4.7 by 4.6 by 5.5 cm lower pole cyst without visible internal septation or nodularity. Separate right mid kidney 1.0 by 0.8 by 0.9 cm peripheral hypodense lesion questionably with internal echoes. Left Kidney: Length: 10.1 cm. Mildly abnormally echogenic parenchyma. No hydronephrosis. 2.3 by 1.5 by 1.6 cm cyst of the right kidney upper pole, questionable slight complexity along margins. Bladder: Appears normal for degree of bladder distention. Median lobe of the prostate indents the bladder base. IMPRESSION: 1. Bilateral mildly abnormally echogenic renal parenchyma, suggesting chronic medical renal disease. 2. 9 mm in diameter peripheral right mid kidney hypodense lesion has faint internal echoes and could be a small complex cyst but  is technically nonspecific. No obvious internal Doppler flow. 3. 2.3 by 1.5 by 1.6 cm left kidney upper pole cystic lesion may have faint marginal complexity but has no visible internal Doppler flow. Electronically Signed   By: Van Clines M.D.   On: 11/30/2016 08:54    echocardiogram    ------------------------------------------------------------------- Indications:      425.4 Other primary Cardiomyopathies.  ------------------------------------------------------------------- History:   PMH:  Sick sinus syndrome.  Atrial fibrillation. Congestive heart failure.  Risk factors:  Hypertension.  ------------------------------------------------------------------- Study Conclusions  - Left ventricle: The cavity size was normal. There was moderate   focal basal and mild concentric hypertrophy. Systolic function   was vigorous. The estimated ejection fraction was in the range of   65% to 70%. Wall motion was normal; there were no regional wall   motion abnormalities. The study is not technically sufficient to   allow evaluation of LV diastolic function. - Mitral valve: Calcified annulus. Mildly calcified leaflets   anterior greater than posterior. There was mild regurgitation. - Pulmonary arteries: PA peak pressure: 42 mm Hg (S). - Pericardium, extracardiac: A possible, trivial, free-flowing   pericardial effusion was identified at the apex. The fluid had no   internal echoes.  Impressions:  -  The right ventricular systolic pressure was increased consistent   with moderate pulmonary hypertension   Filed Weights   11/30/16 1646 12/01/16 0733  Weight: 76.8 kg (169 lb 4.8 oz) 75.6 kg (166 lb 11.2 oz)       Labs: Results for orders placed or performed during the hospital encounter of 11/30/16 (from the past 48 hour(s))  CBC with Differential/Platelet     Status: Abnormal   Collection Time: 11/30/16  3:33 AM  Result Value Ref Range   WBC 5.4 4.0 - 10.5 K/uL   RBC 3.99  (L) 4.22 - 5.81 MIL/uL   Hemoglobin 12.6 (L) 13.0 - 17.0 g/dL   HCT 35.8 (L) 39.0 - 52.0 %   MCV 89.7 78.0 - 100.0 fL   MCH 31.6 26.0 - 34.0 pg   MCHC 35.2 30.0 - 36.0 g/dL   RDW 14.8 11.5 - 15.5 %   Platelets 146 (L) 150 - 400 K/uL   Neutrophils Relative % 79 %   Neutro Abs 4.3 1.7 - 7.7 K/uL   Lymphocytes Relative 8 %   Lymphs Abs 0.4 (L) 0.7 - 4.0 K/uL   Monocytes Relative 12 %   Monocytes Absolute 0.6 0.1 - 1.0 K/uL   Eosinophils Relative 1 %   Eosinophils Absolute 0.0 0.0 - 0.7 K/uL   Basophils Relative 0 %   Basophils Absolute 0.0 0.0 - 0.1 K/uL  Comprehensive metabolic panel     Status: Abnormal   Collection Time: 11/30/16  3:33 AM  Result Value Ref Range   Sodium 125 (L) 135 - 145 mmol/L   Potassium 3.9 3.5 - 5.1 mmol/L   Chloride 92 (L) 101 - 111 mmol/L   CO2 22 22 - 32 mmol/L   Glucose, Bld 106 (H) 65 - 99 mg/dL   BUN 24 (H) 6 - 20 mg/dL   Creatinine, Ser 1.62 (H) 0.61 - 1.24 mg/dL   Calcium 9.1 8.9 - 10.3 mg/dL   Total Protein 6.8 6.5 - 8.1 g/dL   Albumin 3.8 3.5 - 5.0 g/dL   AST 40 15 - 41 U/L   ALT 30 17 - 63 U/L   Alkaline Phosphatase 87 38 - 126 U/L   Total Bilirubin 1.1 0.3 - 1.2 mg/dL   GFR calc non Af Amer 34 (L) >60 mL/min   GFR calc Af Amer 40 (L) >60 mL/min    Comment: (NOTE) The eGFR has been calculated using the CKD EPI equation. This calculation has not been validated in all clinical situations. eGFR's persistently <60 mL/min signify possible Chronic Kidney Disease.    Anion gap 11 5 - 15  Brain natriuretic peptide     Status: Abnormal   Collection Time: 11/30/16  3:33 AM  Result Value Ref Range   B Natriuretic Peptide 502.6 (H) 0.0 - 100.0 pg/mL  I-stat troponin, ED     Status: None   Collection Time: 11/30/16  3:41 AM  Result Value Ref Range   Troponin i, poc 0.02 0.00 - 0.08 ng/mL   Comment 3            Comment: Due to the release kinetics of cTnI, a negative result within the first hours of the onset of symptoms does not rule  out myocardial infarction with certainty. If myocardial infarction is still suspected, repeat the test at appropriate intervals.   Osmolality     Status: Abnormal   Collection Time: 11/30/16  9:23 AM  Result Value Ref Range   Osmolality 270 (L) 275 -  295 mOsm/kg  TSH     Status: None   Collection Time: 11/30/16  9:23 AM  Result Value Ref Range   TSH 3.505 0.350 - 4.500 uIU/mL    Comment: Performed by a 3rd Generation assay with a functional sensitivity of <=0.01 uIU/mL.  Cortisol     Status: None   Collection Time: 11/30/16  9:23 AM  Result Value Ref Range   Cortisol, Plasma 7.6 ug/dL    Comment: (NOTE) AM    6.7 - 22.6 ug/dL PM   <10.0       ug/dL   Vitamin B12     Status: Abnormal   Collection Time: 11/30/16  9:23 AM  Result Value Ref Range   Vitamin B-12 2,462 (H) 180 - 914 pg/mL    Comment: (NOTE) This assay is not validated for testing neonatal or myeloproliferative syndrome specimens for Vitamin B12 levels.   Ferritin     Status: None   Collection Time: 11/30/16  9:23 AM  Result Value Ref Range   Ferritin 67 24 - 336 ng/mL  Iron and TIBC     Status: Abnormal   Collection Time: 11/30/16  9:23 AM  Result Value Ref Range   Iron 34 (L) 45 - 182 ug/dL   TIBC 336 250 - 450 ug/dL   Saturation Ratios 10 (L) 17.9 - 39.5 %   UIBC 302 ug/dL  Sodium, urine, random     Status: None   Collection Time: 11/30/16 11:06 AM  Result Value Ref Range   Sodium, Ur 103 mmol/L  Osmolality, urine     Status: Abnormal   Collection Time: 11/30/16 11:06 AM  Result Value Ref Range   Osmolality, Ur 244 (L) 300 - 900 mOsm/kg  Calcium / creatinine ratio, urine     Status: Abnormal   Collection Time: 11/30/16 11:06 AM  Result Value Ref Range   Calcium, Ur 3.9 Not Estab. mg/dL   Calcium/Creat.Ratio 415 (H) 0 - 260 mg/g creat    Comment: (NOTE) Performed At: [facility] [place], [place] [id number] Lindon Romp MD CB:[id number]    Creatinine, Urine 9.4 Not  Estab. mg/dL  Basic metabolic panel     Status: Abnormal   Collection Time: 11/30/16  5:03 PM  Result Value Ref Range   Sodium 127 (L) 135 - 145 mmol/L   Potassium 3.5 3.5 - 5.1 mmol/L   Chloride 92 (L) 101 - 111 mmol/L   CO2 19 (L) 22 - 32 mmol/L   Glucose, Bld 140 (H) 65 - 99 mg/dL   BUN 25 (H) 6 - 20 mg/dL   Creatinine, Ser 1.73 (H) 0.61 - 1.24 mg/dL   Calcium 9.0 8.9 - 10.3 mg/dL   GFR calc non Af Amer 32 (L) >60 mL/min   GFR calc Af Amer 37 (L) >60 mL/min    Comment: (NOTE) The eGFR has been calculated using the CKD EPI equation. This calculation has not been validated in all clinical situations. eGFR's persistently <60 mL/min signify possible Chronic Kidney Disease.    Anion gap 16 (H) 5 - 15  TSH     Status: None   Collection Time: 11/30/16  5:03 PM  Result Value Ref Range   TSH 3.691 0.350 - 4.500 uIU/mL    Comment: Performed by a 3rd Generation assay with a functional sensitivity of <=0.01 uIU/mL.  Troponin I     Status: Abnormal   Collection Time: 11/30/16  5:03 PM  Result Value Ref Range  Troponin I 0.03 (HH) <0.03 ng/mL    Comment: CRITICAL RESULT CALLED TO, READ BACK BY AND VERIFIED WITH: K.BRADLEY,RN 11/30/16 @ [address]   Troponin I     Status: Abnormal   Collection Time: 11/30/16 11:29 PM  Result Value Ref Range   Troponin I 0.04 (HH) <0.03 ng/mL    Comment: CRITICAL VALUE NOTED.  VALUE IS CONSISTENT WITH PREVIOUSLY REPORTED AND CALLED VALUE.  Troponin I     Status: Abnormal   Collection Time: 12/01/16  5:05 AM  Result Value Ref Range   Troponin I 0.05 (HH) <0.03 ng/mL    Comment: CRITICAL VALUE NOTED.  VALUE IS CONSISTENT WITH PREVIOUSLY REPORTED AND CALLED VALUE.  Comprehensive metabolic panel     Status: Abnormal   Collection Time: 12/01/16  5:05 AM  Result Value Ref Range   Sodium 130 (L) 135 - 145 mmol/L   Potassium 3.5 3.5 - 5.1 mmol/L   Chloride 89 (L) 101 - 111 mmol/L   CO2 30 22 - 32 mmol/L   Glucose, Bld 112 (H) 65 - 99 mg/dL   BUN  25 (H) 6 - 20 mg/dL   Creatinine, Ser 1.76 (H) 0.61 - 1.24 mg/dL   Calcium 9.2 8.9 - 10.3 mg/dL   Total Protein 6.0 (L) 6.5 - 8.1 g/dL   Albumin 3.5 3.5 - 5.0 g/dL   AST 38 15 - 41 U/L   ALT 27 17 - 63 U/L   Alkaline Phosphatase 78 38 - 126 U/L   Total Bilirubin 1.2 0.3 - 1.2 mg/dL   GFR calc non Af Amer 31 (L) >60 mL/min   GFR calc Af Amer 36 (L) >60 mL/min    Comment: (NOTE) The eGFR has been calculated using the CKD EPI equation. This calculation has not been validated in all clinical situations. eGFR's persistently <60 mL/min signify possible Chronic Kidney Disease.    Anion gap 11 5 - 15  CBC     Status: Abnormal   Collection Time: 12/01/16  5:05 AM  Result Value Ref Range   WBC 8.0 4.0 - 10.5 K/uL   RBC 4.05 (L) 4.22 - 5.81 MIL/uL   Hemoglobin 12.6 (L) 13.0 - 17.0 g/dL   HCT 36.2 (L) 39.0 - 52.0 %   MCV 89.4 78.0 - 100.0 fL   MCH 31.1 26.0 - 34.0 pg   MCHC 34.8 30.0 - 36.0 g/dL   RDW 14.9 11.5 - 15.5 %   Platelets 178 150 - 400 K/uL        Lab Results  Component Value Date   CREATININE 1.76 (H) 12/01/2016     HPI :  Eugene Miller,w Afib (Chadsvasc2=3 ), AR, MR , Cardiiomyopathy (EF 45-50%) Sick sinus s/p pacer, apparently presents with c/o dyspnea for a couple of weeks that was worse last nite. Pt has increase in edema.Pt presenting with worsening L groin pain x6 days. Physical exam showed L inguinal hernia. No signs of incarceration, necrosis, or strangulation.   Dr. Eulis Foster reduced the hernia in the ER on 8/3 . Presented with shortness of breath and 8/6 found to have 3+ pitting edema in bilateral lower extremities, echo in 2011 showed an EF of 45-50% Patient found to have a creatinine of 1.62, baseline unknown. Patient admitted to telemetry and diuresed with IV Lasix, 2-D echo shows improvement in EF, cardiology was consulted for further evaluation. Initial sodium 125. Chest x-ray consistent with right pleural effusion with associated right basilar opacity with  pulmonary vascular congestion  HOSPITAL COURSE:  1. Acute on Chronic Diastolic CHF - patient presents with a 4-day history of worsening dyspnea on exertion, orthopnea, and lower extremity edema. EF at 45-50% by last echo in 2011. - he denies any recent chest pain or palpitations. Consumes a low-sodium diet.  - he does have mild rales on examination and significant lower extremity edema.  - BNP elevated to 502. CXR shows a small right pleural effusion with associated right basilar opacity, which may reflect atelectasis or infiltrate and cardiomegaly with mild perihilar vascular congestion without overt pulmonary edema. Repeat echocardiogram results as above Patient seen by cardiology, Suspect CHF decompensation due to asynchronous VVI pacing on background of diastolic dysfunction due to presbycardia, cardiology consulted EP for generator change, this was done prior to discharge  2. Sick sinus syndrome - s/p [employer] PPM placement in 2002 - Followed by Dr. Curt Bears. Status post generator change   3. PAF - device interrogation in 09/2016 showed < 0.1% AT/AF burden. - This patients CHA2DS2-VASc Score and unadjusted Ischemic Stroke Rate (% per year) is equal to 3.2 % stroke rate/year from a score of 3 (HTN, Age (2)). Not on anticoagulation secondary to advanced age and fall risk.  4. Hyponatremia, improved, now 1:30 - Na+ 125 on admission. No prior labs available for comparison.  -Continue Lasix 40 mg a day, hold Zaroxolyn  5. Renal Insufficiency - creatinine 1.62 on admission (unknown baseline). Creatinine slightly up from baseline due to IV diuresis Patient switch to oral Lasix  7. Inguinal hernia Daughter requested CT abdomen. Did not show any acute findings , needs follow up CT for 11 mm Indeterminate partially exophytic 11 mm lesion off the interpolar region of the right kidney,discussed results with daughter   Discharge Exam:  Blood pressure (!) 148/65, pulse 60, temperature  97.7 F (36.5 C), temperature source Oral, resp. rate 18, height _0  (1.651 m), weight 75.6 kg (166 lb 11.2 oz), SpO2 95 %.   HEENT: Normal           Neck: Supple without bruits. JVD at 9 cm. Lungs:  Resp regular and unlabored, rales at bases bilaterally. Heart: RRR no s3, s4, or murmurs. Abdomen: Soft, non-tender, non-distended, BS + x 4.  Extremities: No clubbing or cyanosis. 2+ pitting edema up to mid-shins bilaterally. DP/PT/Radials 2+ and equal bilaterally.      SignedReyne Dumas 12/01/2016, 1:33 PM        Time spent >1 hour

## 2016-12-01 NOTE — Progress Notes (Signed)
Patient seen and examined. Feeling improved since generator change yesterday. Small hematoma over device site. Continue to monitor. No further EP intervention. Please call if further issues.  Allegra Lai, MD

## 2016-12-01 NOTE — Progress Notes (Signed)
Patient ambulated in hallway with walker without any complications.

## 2016-12-03 ENCOUNTER — Encounter (HOSPITAL_COMMUNITY): Payer: Self-pay | Admitting: Emergency Medicine

## 2016-12-03 ENCOUNTER — Emergency Department (HOSPITAL_COMMUNITY)
Admission: EM | Admit: 2016-12-03 | Discharge: 2016-12-04 | Disposition: A | Discharge status: Home / Self Care | Payer: Medicare Other | Attending: Emergency Medicine | Admitting: Emergency Medicine

## 2016-12-03 DIAGNOSIS — T8189XA Other complications of procedures, not elsewhere classified, initial encounter: Secondary | ICD-10-CM | POA: Diagnosis not present

## 2016-12-03 DIAGNOSIS — Z950 Presence of cardiac pacemaker: Secondary | ICD-10-CM | POA: Diagnosis not present

## 2016-12-03 DIAGNOSIS — Z7982 Long term (current) use of aspirin: Secondary | ICD-10-CM | POA: Diagnosis not present

## 2016-12-03 DIAGNOSIS — I509 Heart failure, unspecified: Secondary | ICD-10-CM | POA: Insufficient documentation

## 2016-12-03 DIAGNOSIS — Z87891 Personal history of nicotine dependence: Secondary | ICD-10-CM | POA: Diagnosis not present

## 2016-12-03 DIAGNOSIS — T82837A Hemorrhage of cardiac prosthetic devices, implants and grafts, initial encounter: Secondary | ICD-10-CM | POA: Diagnosis not present

## 2016-12-03 DIAGNOSIS — Z880 Allergy status to penicillin: Secondary | ICD-10-CM | POA: Insufficient documentation

## 2016-12-03 DIAGNOSIS — I110 Hypertensive heart disease with heart failure: Secondary | ICD-10-CM | POA: Diagnosis not present

## 2016-12-03 DIAGNOSIS — Z85828 Personal history of other malignant neoplasm of skin: Secondary | ICD-10-CM | POA: Insufficient documentation

## 2016-12-03 DIAGNOSIS — Y828 Other medical devices associated with adverse incidents: Secondary | ICD-10-CM | POA: Insufficient documentation

## 2016-12-03 DIAGNOSIS — Z79899 Other long term (current) drug therapy: Secondary | ICD-10-CM | POA: Diagnosis not present

## 2016-12-03 NOTE — ED Triage Notes (Signed)
Pt had pacemaker placed Monday, and since discharge Tuesday, he has had bleeding around the site. Dried blood noted on pt's shirt, no active bleeding at this time.

## 2016-12-04 ENCOUNTER — Telehealth: Payer: Self-pay | Admitting: Cardiology

## 2016-12-04 ENCOUNTER — Telehealth: Payer: Self-pay | Admitting: *Deleted

## 2016-12-04 ENCOUNTER — Ambulatory Visit (INDEPENDENT_AMBULATORY_CARE_PROVIDER_SITE_OTHER): Payer: Self-pay | Admitting: *Deleted

## 2016-12-04 DIAGNOSIS — T8189XA Other complications of procedures, not elsewhere classified, initial encounter: Secondary | ICD-10-CM | POA: Diagnosis not present

## 2016-12-04 DIAGNOSIS — I442 Atrioventricular block, complete: Secondary | ICD-10-CM

## 2016-12-04 MED ORDER — DOXYCYCLINE HYCLATE 100 MG PO TABS: 100.0000 mg | ORAL_TABLET | Freq: Two times a day (BID) | ORAL | 0 refills | Status: DC

## 2016-12-04 MED ORDER — "THROMBI-PAD 3""X3"" EX PADS"
1.0000 | MEDICATED_PAD | Freq: Once | CUTANEOUS | Status: AC
  Administered 2016-12-04: 1 via TOPICAL

## 2016-12-04 NOTE — Telephone Encounter (Signed)
LMTCB//sss 

## 2016-12-04 NOTE — Discharge Instructions (Signed)
Leave the dressing in place until the morning. Return if you are having any problems.

## 2016-12-04 NOTE — Progress Notes (Signed)
Patient presents to the office as a f/u from ED for bleeding at ppm incision. Thrombi pad removed. No active bleeding noted. Steri strips were no longer in place. Incision edges approximated. Dark purple ecchymosis noted overtop of device site, small hematoma-soft upon palpation. Dr.Klein assessed wound and recommended application of pressure dressing and Doxycycline 100mg  BID x 7days. Steri strips reapplied. Pressure dressing applied. Patient was instructed to leave pressure dressing in place until appt on 8/15 (per SK). Patient was also instructed to call if he notices any fever, chills, or active bleeding. Patient verbalized understanding of instructions. Rx sent to [facility] on [address], patient and daughter in law aware.

## 2016-12-04 NOTE — Telephone Encounter (Signed)
Spoke to caregiver about patient's device site. She states that patient went to the ER last night with c/o bleeding. She said that a dressing was applied to the site and they were instructed to remove it this am. She wanted to know if patient could come into the office to have the dressing removed and to ensure that the bleeding has stopped. Appt made for today @ 1200. Caregiver verbalized understanding.

## 2016-12-04 NOTE — ED Provider Notes (Signed)
[facility] DEPT Provider Note   CSN: [id number] Arrival date & time: 12/03/16  1949     History   Chief Complaint Chief Complaint  Patient presents with  . Post-op Problem    HPI Eugene Miller is a 83 y.o. male.  The history is provided by the patient.  Patient had a pacemaker generator change on August 7. Yesterday, he noted some slight spotting at the incision site. Bleeding became more pronounced today. He denies any pain there. Denies any fever. Denies any dyspnea. He is not on any anticoagulants other than low-dose aspirin.  Past Medical History:  Diagnosis Date  . A-fib ([place])   . Aortic regurgitation   . Arthritis   . Basal cell carcinoma (BCC) of nostril   . Cardiac disease   . Deaf   . Gout   . Hypertension   . Lumbago   . Migraine headache   . Mitral regurgitation   . Sinusitis   . Spinal stenosis   . SSS (sick sinus syndrome) ([place])   . Tricuspid regurgitation   . Vertigo     Patient Active Problem List   Diagnosis Date Noted  . CHF exacerbation ([place]) 11/30/2016  . Hyponatremia 11/30/2016  . Anemia 11/30/2016  . Renal insufficiency 11/30/2016  . Edema 11/30/2016  . Gout 11/30/2016  . History of sick sinus syndrome 11/30/2016  . CHF (congestive heart failure) ([place]) 11/30/2016  . Complete heart block ([place])   . Paroxysmal atrial fibrillation ([place]) 10/20/2016  . Sick sinus syndrome ([place]) 10/20/2016  . Essential hypertension 10/20/2016    Past Surgical History:  Procedure Laterality Date  . COLONOSCOPY W/ POLYPECTOMY    . EP IMPLANTABLE DEVICE     [id number], 2002,  . INSERT / REPLACE / REMOVE PACEMAKER    . LAMINECTOMY    . NASAL SINUS SURGERY    . NOSE SURGERY     REMOVAL OF BASAL CELL CA  . PPM GENERATOR CHANGEOUT  02/2008  . PPM GENERATOR CHANGEOUT N/A 11/30/2016   Procedure: PPM GENERATOR CHANGEOUT;  Surgeon: Constance Haw, MD;  Location: [facility];  Service: Cardiovascular;  Laterality: N/A;  . TONSILLECTOMY    . TOTAL  HIP ARTHROPLASTY Right        Home Medications    Prior to Admission medications   Medication Sig Start Date End Date Taking? Authorizing Provider  allopurinol (ZYLOPRIM) 300 MG tablet Take 300 mg by mouth daily.    [provider]  aspirin EC 81 MG tablet Take 81 mg by mouth daily.    [provider]  B Complex Vitamins (VITAMIN B COMPLEX PO) Take 1 capsule by mouth daily.    [provider]  Calcium Carbonate-Vitamin D3 (CALCIUM 600-D) 600-400 MG-UNIT TABS Take 1 tablet by mouth 2 (two) times daily.    [provider]  finasteride (PROSCAR) 5 MG tablet Take 5 mg by mouth daily.    [provider]  furosemide (LASIX) 40 MG tablet Take 1 tablet (40 mg total) by mouth daily. 12/02/16   Reyne Dumas, MD  ketotifen (ZADITOR) 0.025 % ophthalmic solution Place 1 drop into both eyes 2 (two) times daily.    [provider]  Multiple Vitamins-Minerals (ICAPS AREDS 2 PO) Take 1 capsule by mouth 2 (two) times daily.    [provider]    Family History Family History  Problem Relation Age of Onset  . Cancer Mother   . Prostate cancer Father     Social  History Social History  Substance Use Topics  . Smoking status: Former Smoker    Types: Cigarettes  . Smokeless tobacco: Never Used  . Alcohol use No     Allergies   Atenolol; Lisinopril; Nifedipine; Penicillins; Sulfa antibiotics; and Zocor [simvastatin]   Review of Systems Review of Systems  All other systems reviewed and are negative.    Physical Exam Updated Vital Signs BP 134/61 (BP Location: Right Arm)   Pulse 75   Temp 98.2 F (36.8 C) (Oral)   Resp 18   Ht 5\' 5"  (1.651 m)   Wt 75.3 kg (166 lb)   SpO2 93%   BMI 27.62 kg/m   Physical Exam  Nursing note and vitals reviewed.  83 year old male, resting comfortably and in no acute distress. Vital signs are normal. Oxygen saturation is 93%, which is normal. Head is normocephalic and atraumatic. PERRLA,  EOMI. Oropharynx is clear. Neck is nontender and supple without adenopathy or JVD. Back is nontender and there is no CVA tenderness. Lungs are clear without rales, wheezes, or rhonchi. Chest: Pacemaker present in the left subclavian area. Incision has very slow oozing from the medial aspect. No erythema or swelling. Heart has regular rate and rhythm without murmur. Abdomen is soft, flat, nontender without masses or hepatosplenomegaly and peristalsis is normoactive. Extremities have no cyanosis or edema, full range of motion is present. Skin is warm and dry without rash. Neurologic: Mental status is normal, cranial nerves are intact, there are no motor or sensory deficits.  ED Treatments / Results   Procedures Procedures (including critical care time)  Medications Ordered in ED Medications  THROMBI-PAD 3"X3" pad 1 each (not administered)     Initial Impression / Assessment and Plan / ED Course  I have reviewed the triage vital signs and the nursing notes.  Bleeding from pacemaker insertion site. Old records are reviewed confirming pacemaker generator change 3 days ago. Thrombi-Pad is applied.  Bleeding is controlled with above noted treatment. Dressing is left in place and he is referred back to his cardiologist for follow-up.  Final Clinical Impressions(s) / ED Diagnoses   Final diagnoses:  Problem involving surgical incision    New Prescriptions New Prescriptions   No medications on file     Delora Fuel, MD 15/17/61 [phone]

## 2016-12-04 NOTE — Patient Instructions (Signed)
Your physician has recommended you make the following change in your medication: START Doxycycline 100 mg twice daily for 7 days  Follow up with the Device clinic on 8/15 @1 :30pm.  Pressure dressing will be removed at your appointment on 12/09/16.

## 2016-12-04 NOTE — Telephone Encounter (Signed)
New message    Pt is having a lot of bleeding at the incision cite, he has a pacemaker battery installed

## 2016-12-07 NOTE — Telephone Encounter (Signed)
Informed dtr (ok to speak w/ her per pt) that it is ok for pt to take lasix in the morning. She verbalized understanding and agreeable to plan.

## 2016-12-07 NOTE — Telephone Encounter (Signed)
New message    Pt daughter is calling to ask about his lasix.  Pt c/o medication issue:  1. Name of Medication: lasix  2. How are you currently taking this medication (dosage and times per day)? Once daily at night  3. Are you having a reaction (difficulty breathing--STAT)? no  4. What is your medication issue? Pt daughter wants to know if pt can take this medication in the morning. He told her he was to take it at night, but she is concerned because he gets up repeatedly during the night to go to the bathroom.

## 2016-12-08 ENCOUNTER — Telehealth: Payer: Self-pay | Admitting: Cardiology

## 2016-12-08 NOTE — Telephone Encounter (Signed)
Patient daughter Eugene Miller) calling, states that she would like to know if patient could reduce his lasix dosage. Eugene Miller states that he is having a hard time with the dosage and has to use restroom too frequently.

## 2016-12-08 NOTE — Telephone Encounter (Signed)
Advised dtr to have pt decrease Lasix to 20 mg daily until he follows up w/ [facility] next week. Monitor for swelling/SOB and call office if symptoms begin.  Dtr is agreeable to plan.

## 2016-12-09 ENCOUNTER — Ambulatory Visit (INDEPENDENT_AMBULATORY_CARE_PROVIDER_SITE_OTHER): Payer: Medicare Other | Admitting: *Deleted

## 2016-12-09 DIAGNOSIS — I442 Atrioventricular block, complete: Secondary | ICD-10-CM | POA: Diagnosis not present

## 2016-12-09 DIAGNOSIS — Z950 Presence of cardiac pacemaker: Secondary | ICD-10-CM | POA: Diagnosis not present

## 2016-12-09 DIAGNOSIS — I495 Sick sinus syndrome: Secondary | ICD-10-CM

## 2016-12-09 LAB — CUP PACEART INCLINIC DEVICE CHECK
Battery Remaining Longevity: 105 mo
Brady Statistic AP VP Percent: 32.04 %
Brady Statistic AP VS Percent: 0 %
Brady Statistic AS VP Percent: 65.84 %
Brady Statistic AS VS Percent: 2.12 %
Date Time Interrogation Session: 20180815174608
Implantable Lead Location: 753859
Implantable Lead Model: 4024
Lead Channel Impedance Value: 209 Ohm
Lead Channel Impedance Value: 323 Ohm
Lead Channel Impedance Value: 418 Ohm
Lead Channel Pacing Threshold Pulse Width: 0.4 ms
Lead Channel Sensing Intrinsic Amplitude: 3.75 mV
Lead Channel Setting Pacing Amplitude: 2.5 V
Lead Channel Setting Pacing Pulse Width: 0.4 ms
Lead Channel Setting Sensing Sensitivity: 4 mV
MDC IDC LEAD IMPLANT DT: 19941122
MDC IDC LEAD IMPLANT DT: 20020426
MDC IDC LEAD LOCATION: 753860
MDC IDC MSMT BATTERY VOLTAGE: 3.21 V
MDC IDC MSMT LEADCHNL RA PACING THRESHOLD AMPLITUDE: 0.75 V
MDC IDC MSMT LEADCHNL RA PACING THRESHOLD PULSEWIDTH: 0.4 ms
MDC IDC MSMT LEADCHNL RV IMPEDANCE VALUE: 190 Ohm
MDC IDC MSMT LEADCHNL RV PACING THRESHOLD AMPLITUDE: 1.25 V
MDC IDC PG IMPLANT DT: 20180806
MDC IDC SET LEADCHNL RA PACING AMPLITUDE: 2.5 V
MDC IDC STAT BRADY RA PERCENT PACED: 31.77 %
MDC IDC STAT BRADY RV PERCENT PACED: 97.87 %

## 2016-12-09 NOTE — Progress Notes (Signed)
Wound check appointment. Pressure dressing and steri-strips removed. Small hematoma still present over site, soft to palpation. Incision edges approximated. Extensive purple to yellowish ecchymosis present across L chest. SK saw patient, advised against additional pressure dressing at this time. Plan for hematoma recheck at appointment with [facility] on 12/16/16. Patient encouraged to monitor for signs/symptoms of infection. He will plan to finish course of doxycycline as previously ordered.   Device checked by industry. Normal device function. Thresholds, sensing, and impedances consistent with implant measurements. RV uni/bipolar impedance chronically low per implant note (209ohms unipolar today). RV pacing lead impedance alert turned off per industry recommendation (unable to lower alert below 200ohms). Device programmed at chronic outputs--RA min output increased to 2.0V per clinic protocol. Histogram distribution appropriate for patient and level of activity. 24 mode switches (3.4%)--AT/PACs per EGMs, longest with EGM ~22min duration, no OAC due to high fall risk. No high ventricular rates noted. Patient educated about wound care, arm mobility, lifting restrictions, and Carelink monitor. ROV with WC on 12/16/16 for hematoma recheck.

## 2016-12-10 NOTE — Telephone Encounter (Signed)
Spoke with dtr informing her 8/30 scheduled generator change cancelled d/t pt already having procedure on 8/6. They will keep next week appt to check his hematoma. Dtr is agreeable to plan.

## 2016-12-10 NOTE — Telephone Encounter (Signed)
Patient daughter Butch Penny) returning your call

## 2016-12-11 ENCOUNTER — Telehealth: Payer: Self-pay | Admitting: Cardiology

## 2016-12-11 NOTE — Telephone Encounter (Signed)
Informed patient that we will have him sign a release form when he is in the office next Wednesday.  He understands once release signed I can send requested information to the [place]. He thanks me for letting him know.

## 2016-12-11 NOTE — Telephone Encounter (Signed)
New message    Pt is calling stating that the [facility] called him and the doctor there wants a letter describing what happened to him from the time he came to the doctor and the hospital. [phone].

## 2016-12-16 ENCOUNTER — Encounter: Payer: Self-pay | Admitting: Cardiology

## 2016-12-16 ENCOUNTER — Ambulatory Visit (INDEPENDENT_AMBULATORY_CARE_PROVIDER_SITE_OTHER): Payer: Medicare Other | Admitting: Cardiology

## 2016-12-16 VITALS — BP 140/78 | HR 78 | Ht 65.0 in | Wt 159.8 lb

## 2016-12-16 DIAGNOSIS — I480 Paroxysmal atrial fibrillation: Secondary | ICD-10-CM | POA: Diagnosis not present

## 2016-12-16 DIAGNOSIS — I495 Sick sinus syndrome: Secondary | ICD-10-CM

## 2016-12-16 DIAGNOSIS — I10 Essential (primary) hypertension: Secondary | ICD-10-CM | POA: Diagnosis not present

## 2016-12-16 MED ORDER — POLYETHYLENE GLYCOL 3350 17 G PO PACK: 17.0000 g | PACK | Freq: Every day | ORAL | 0 refills | Status: AC | PRN

## 2016-12-16 NOTE — Addendum Note (Signed)
Addended by: Stanton Kidney on: 12/16/2016 10:23 AM   Modules accepted: Orders

## 2016-12-16 NOTE — Patient Instructions (Signed)
Medication Instructions:  Your physician has recommended you make the following change in your medication:  1. You may take Miralax daily as needed for constipation.  If you need a refill on your cardiac medications before your next appointment, please call your pharmacy.   Labwork: None ordered  Testing/Procedures: None ordered  Follow-Up: Your physician recommends that you schedule a follow-up appointment in: 3 months with Dr. Curt Bears.  Thank you for choosing [facility]!!   Trinidad Curet, RN [phone]

## 2016-12-16 NOTE — Progress Notes (Signed)
Electrophysiology Office Note   Date:  12/16/2016   ID:  Ahren, Pettinger 1920-02-05, MRN [id number]  PCP:  System, Pcp Not In Primary Electrophysiologist:  Nili Honda Meredith Leeds, MD    Chief Complaint  Patient presents with  . Pacemaker Check    Sick sinus syndrome/complete heart block     History of Present Illness: Eugene Miller is a 83 y.o. male who is being seen today for the evaluation of sick sinus syndrome at the request of No ref. provider found. Presenting today for electrophysiology evaluation. He has history of hypertension, paroxysmal atrial fibrillation, sick sinus syndrome, and has a pacemaker which was initially implanted on 08/20/2000. He recently relocated to [place] from [place] to be closer to his family. Had pacemaker generator change 07/26/72 complicated by hematoma.   Today, denies symptoms of palpitations, chest pain, shortness of breath, orthopnea, PND, lower extremity edema, claudication, dizziness, presyncope, syncope, bleeding, or neurologic sequela. The patient is tolerating medications without difficulties and is otherwise without complaint today.  He is continuing to be active. He has been walking morning and night. He is continuing to lose weight since his hospitalization. He feels like his legs have gotten quite a bit smaller.  Of note, he fall in [place] War II in [place] and [place]. He brought in a listing of his metals today.  Past Medical History:  Diagnosis Date  . A-fib ([place])   . Aortic regurgitation   . Arthritis   . Basal cell carcinoma (BCC) of nostril   . Cardiac disease   . Deaf   . Gout   . Hypertension   . Lumbago   . Migraine headache   . Mitral regurgitation   . Sinusitis   . Spinal stenosis   . SSS (sick sinus syndrome) ([place])   . Tricuspid regurgitation   . Vertigo    Past Surgical History:  Procedure Laterality Date  . COLONOSCOPY W/ POLYPECTOMY    . EP IMPLANTABLE DEVICE     [id number], 2002,  . INSERT / REPLACE /  REMOVE PACEMAKER    . LAMINECTOMY    . NASAL SINUS SURGERY    . NOSE SURGERY     REMOVAL OF BASAL CELL CA  . PPM GENERATOR CHANGEOUT  02/2008  . PPM GENERATOR CHANGEOUT N/A 11/30/2016   Procedure: PPM GENERATOR CHANGEOUT;  Surgeon: Constance Haw, MD;  Location: [facility];  Service: Cardiovascular;  Laterality: N/A;  . TONSILLECTOMY    . TOTAL HIP ARTHROPLASTY Right      Current Outpatient Prescriptions  Medication Sig Dispense Refill  . allopurinol (ZYLOPRIM) 300 MG tablet Take 300 mg by mouth daily.    Marland Kitchen aspirin EC 81 MG tablet Take 81 mg by mouth daily.    . B Complex Vitamins (VITAMIN B COMPLEX PO) Take 1 capsule by mouth daily.    . Calcium Carbonate-Vitamin D3 (CALCIUM 600-D) 600-400 MG-UNIT TABS Take 1 tablet by mouth 2 (two) times daily.    . finasteride (PROSCAR) 5 MG tablet Take 5 mg by mouth daily.    . furosemide (LASIX) 40 MG tablet Take 1 tablet (40 mg total) by mouth daily. 30 tablet 1  . ketotifen (ZADITOR) 0.025 % ophthalmic solution Place 1 drop into both eyes 2 (two) times daily.    . Multiple Vitamins-Minerals (ICAPS AREDS 2 PO) Take 1 capsule by mouth 2 (two) times daily.     No current facility-administered medications for this visit.     Allergies:  Atenolol; Lisinopril; Nifedipine; Penicillins; Sulfa antibiotics; and Zocor [simvastatin]   Social History:  The patient  reports that he has quit smoking. His smoking use included Cigarettes. He has never used smokeless tobacco. He reports that he does not drink alcohol or use drugs.   Family History:  The patient's family history includes Cancer in his mother; Prostate cancer in his father.    ROS:  Please see the history of present illness.   Otherwise, review of systems is positive for Constipation, back pain.   All other systems are reviewed and negative.   PHYSICAL EXAM: VS:  BP 140/78   Pulse 78   Ht 5\' 5"  (1.651 m)   Wt 159 lb 12.8 oz (72.5 kg)   BMI 26.59 kg/m  , BMI Body mass index  is 26.59 kg/m. GEN: Well nourished, well developed, in no acute distress  HEENT: normal  Neck: no JVD, carotid bruits, or masses Cardiac: RRR; no murmurs, rubs, or gallops, 1+edema  Respiratory:  clear to auscultation bilaterally, normal work of breathing GI: soft, nontender, nondistended, + BS MS: no deformity or atrophy  Skin: warm and dry, device site well healed, hematoma over the pocket with significant bruising. Hematoma is golf ball size Neuro:  Strength and sensation are intact Psych: euthymic mood, full affect  EKG:  EKG is not ordered today. Personal review of the ekg ordered 12/04/16 shows SR, complete AV block, V pacing  Personal review of the device interrogation today. Results in [place]: 11/30/2016: B Natriuretic Peptide 502.6; TSH 3.691 12/01/2016: ALT 27; BUN 25; Creatinine, Ser 1.76; Hemoglobin 12.6; Platelets 178; Potassium 3.5; Sodium 130    Lipid Panel  No results found for: CHOL, TRIG, HDL, CHOLHDL, VLDL, LDLCALC, LDLDIRECT   Wt Readings from Last 3 Encounters:  12/16/16 159 lb 12.8 oz (72.5 kg)  12/03/16 166 lb (75.3 kg)  12/01/16 166 lb 11.2 oz (75.6 kg)      Other studies Reviewed: Additional studies/ records that were reviewed today include: prior cardiology records, TTE 2011  Review of the above records today demonstrates:  Ejection fraction 45-50%, LV in her septum mildly hypokinetic. Normal size left atrium aortic sclerosis present mild to moderate aortic regurgitation. Mild mitral regurgitation. RV systolic pressure 18.5 mmHg.  SPECT 2011 P.m. defect in the inferior wall not reversible consistent with attenuation artifact, ejection fraction 60%  ASSESSMENT AND PLAN:  1.  Atrial fibrillation: Resented to the hospital with heart failure, was found that his pacemaker was at [facility] and was asynchronously pacing. Had generator change and has felt better since. Is currently in sinus rhythm. No changes.  This patients CHA2DS2-VASc Score and  unadjusted Ischemic Stroke Rate (% per year) is equal to 3.2 % stroke rate/year from a score of 3  Above score calculated as 1 point each if present [CHF, HTN, DM, Vascular=MI/PAD/Aortic Plaque, Age if 65-74, or Male] Above score calculated as 2 points each if present [Age > 75, or Stroke/TIA/TE]  2. Sick sinus syndrome: Generator change 09/27/12 complicated by hematoma. Doing well without issue. Continue current management.  3. Hypertension: Mildly elevated today, but has been normal in the past. No changes.  4. Constipation: Encouraged MiraLAX as needed   Current medicines are reviewed at length with the patient today.   The patient does not have concerns regarding his medicines.  The following changes were made today:  none  Labs/ tests ordered today include:  No orders of the defined types were placed in this encounter.  Disposition:   FU with Kaydee Magel 2 months  Signed, Eliette Drumwright Meredith Leeds, MD  12/16/2016 10:01 AM     [facility] [address] [place] [address] [phone] (office) [phone] (fax)

## 2016-12-24 ENCOUNTER — Encounter (HOSPITAL_COMMUNITY): Payer: Self-pay

## 2016-12-24 ENCOUNTER — Ambulatory Visit (HOSPITAL_COMMUNITY): Admit: 2016-12-24 | Payer: Medicare Other | Admitting: Cardiology

## 2016-12-24 SURGERY — PPM GENERATOR CHANGEOUT

## 2016-12-26 ENCOUNTER — Emergency Department (HOSPITAL_COMMUNITY): Payer: Medicare Other

## 2016-12-26 ENCOUNTER — Emergency Department (HOSPITAL_COMMUNITY)
Admission: EM | Admit: 2016-12-26 | Discharge: 2016-12-26 | Disposition: A | Discharge status: Home / Self Care | Payer: Medicare Other | Attending: Emergency Medicine | Admitting: Emergency Medicine

## 2016-12-26 ENCOUNTER — Encounter (HOSPITAL_COMMUNITY): Payer: Self-pay | Admitting: Emergency Medicine

## 2016-12-26 DIAGNOSIS — Y939 Activity, unspecified: Secondary | ICD-10-CM | POA: Diagnosis not present

## 2016-12-26 DIAGNOSIS — S0181XA Laceration without foreign body of other part of head, initial encounter: Secondary | ICD-10-CM | POA: Diagnosis not present

## 2016-12-26 DIAGNOSIS — Y929 Unspecified place or not applicable: Secondary | ICD-10-CM | POA: Insufficient documentation

## 2016-12-26 DIAGNOSIS — Z87891 Personal history of nicotine dependence: Secondary | ICD-10-CM | POA: Diagnosis not present

## 2016-12-26 DIAGNOSIS — W010XXA Fall on same level from slipping, tripping and stumbling without subsequent striking against object, initial encounter: Secondary | ICD-10-CM

## 2016-12-26 DIAGNOSIS — W0110XA Fall on same level from slipping, tripping and stumbling with subsequent striking against unspecified object, initial encounter: Secondary | ICD-10-CM | POA: Insufficient documentation

## 2016-12-26 DIAGNOSIS — S0990XA Unspecified injury of head, initial encounter: Secondary | ICD-10-CM | POA: Diagnosis not present

## 2016-12-26 DIAGNOSIS — I509 Heart failure, unspecified: Secondary | ICD-10-CM | POA: Diagnosis not present

## 2016-12-26 DIAGNOSIS — Z85828 Personal history of other malignant neoplasm of skin: Secondary | ICD-10-CM | POA: Diagnosis not present

## 2016-12-26 DIAGNOSIS — Z23 Encounter for immunization: Secondary | ICD-10-CM | POA: Insufficient documentation

## 2016-12-26 DIAGNOSIS — S0101XA Laceration without foreign body of scalp, initial encounter: Secondary | ICD-10-CM | POA: Diagnosis not present

## 2016-12-26 DIAGNOSIS — Z7982 Long term (current) use of aspirin: Secondary | ICD-10-CM | POA: Insufficient documentation

## 2016-12-26 DIAGNOSIS — R51 Headache: Secondary | ICD-10-CM | POA: Diagnosis not present

## 2016-12-26 DIAGNOSIS — S098XXA Other specified injuries of head, initial encounter: Secondary | ICD-10-CM | POA: Diagnosis not present

## 2016-12-26 DIAGNOSIS — I110 Hypertensive heart disease with heart failure: Secondary | ICD-10-CM | POA: Diagnosis not present

## 2016-12-26 DIAGNOSIS — S0102XA Laceration with foreign body of scalp, initial encounter: Secondary | ICD-10-CM | POA: Insufficient documentation

## 2016-12-26 DIAGNOSIS — Y999 Unspecified external cause status: Secondary | ICD-10-CM | POA: Insufficient documentation

## 2016-12-26 DIAGNOSIS — Z96641 Presence of right artificial hip joint: Secondary | ICD-10-CM | POA: Diagnosis not present

## 2016-12-26 DIAGNOSIS — S1191XA Laceration without foreign body of unspecified part of neck, initial encounter: Secondary | ICD-10-CM | POA: Diagnosis not present

## 2016-12-26 DIAGNOSIS — M545 Low back pain: Secondary | ICD-10-CM | POA: Diagnosis not present

## 2016-12-26 MED ORDER — LIDOCAINE-EPINEPHRINE (PF) 2 %-1:200000 IJ SOLN
10.0000 mL | Freq: Once | INTRAMUSCULAR | Status: AC
  Administered 2016-12-26: 10 mL
  Filled 2016-12-26: qty 20

## 2016-12-26 MED ORDER — BACITRACIN ZINC 500 UNIT/GM EX OINT
TOPICAL_OINTMENT | Freq: Once | CUTANEOUS | Status: AC
  Administered 2016-12-26: 1 via TOPICAL
  Filled 2016-12-26: qty 0.9

## 2016-12-26 MED ORDER — TETANUS-DIPHTH-ACELL PERTUSSIS 5-2.5-18.5 LF-MCG/0.5 IM SUSP
0.5000 mL | Freq: Once | INTRAMUSCULAR | Status: AC
  Administered 2016-12-26: 0.5 mL via INTRAMUSCULAR
  Filled 2016-12-26: qty 0.5

## 2016-12-26 MED ORDER — ACETAMINOPHEN 325 MG PO TABS
650.0000 mg | ORAL_TABLET | Freq: Once | ORAL | Status: AC
  Administered 2016-12-26: 650 mg via ORAL
  Filled 2016-12-26: qty 2

## 2016-12-26 NOTE — ED Triage Notes (Signed)
Pt brought to ED by [facility] from [facility], pt has a DNR document from [place]. Pt lost his balance and fell hit his head on the back with a small laceration and bleeding controlled pta to ED, pt has a lower back pain no on blood thinners only ASA. Pt denies any LOC.  Vitals for EMS  BP 192/106 HR: 96 R-18 SPO2 96-97% on RA

## 2016-12-26 NOTE — Discharge Instructions (Signed)
It was our pleasure to provide your ER care today - we hope that you feel better.  Fall precautions.  Make sure to use your walker for stability when up and around.  Keep wound very clean.  Have sutures removed, by your doctor/nurse or urgent care, in 1 week.  Return to ER if worse, new symptoms, infection of wound, other concern.

## 2016-12-26 NOTE — ED Provider Notes (Signed)
[facility] DEPT Provider Note   CSN: [id number] Arrival date & time: 12/26/16  2113     History   Chief Complaint Chief Complaint  Patient presents with  . Fall    HPI Eugene Miller is a 83 y.o. male.  Patient s/p fall at [facility] shortly prior to arrival tonight.  Pt states he tried to get up without his walker or cane. Tried to grab onto something but missed and fell back and to left. Hit head. Brief loc. Laceration to scalp. Pt c/o head and neck pain. No numbness/weakness. Nausea. No vomiting. Tetanus unknown. Denies other pain or injury. State felt fine earlier today. Denies any faintness/dizziness.    The history is provided by the patient, a relative and the EMS personnel.  Fall  Associated symptoms include headaches. Pertinent negatives include no chest pain, no abdominal pain and no shortness of breath.    Past Medical History:  Diagnosis Date  . A-fib ([place])   . Aortic regurgitation   . Arthritis   . Basal cell carcinoma (BCC) of nostril   . Cardiac disease   . Deaf   . Gout   . Hypertension   . Lumbago   . Migraine headache   . Mitral regurgitation   . Sinusitis   . Spinal stenosis   . SSS (sick sinus syndrome) ([place])   . Tricuspid regurgitation   . Vertigo     Patient Active Problem List   Diagnosis Date Noted  . CHF exacerbation ([place]) 11/30/2016  . Hyponatremia 11/30/2016  . Anemia 11/30/2016  . Renal insufficiency 11/30/2016  . Edema 11/30/2016  . Gout 11/30/2016  . History of sick sinus syndrome 11/30/2016  . CHF (congestive heart failure) ([place]) 11/30/2016  . Complete heart block ([place])   . Paroxysmal atrial fibrillation ([place]) 10/20/2016  . Sick sinus syndrome ([place]) 10/20/2016  . Essential hypertension 10/20/2016    Past Surgical History:  Procedure Laterality Date  . COLONOSCOPY W/ POLYPECTOMY    . EP IMPLANTABLE DEVICE     [id number], 2002,  . INSERT / REPLACE / REMOVE PACEMAKER    . LAMINECTOMY    . NASAL SINUS SURGERY    . NOSE  SURGERY     REMOVAL OF BASAL CELL CA  . PPM GENERATOR CHANGEOUT  02/2008  . PPM GENERATOR CHANGEOUT N/A 11/30/2016   Procedure: PPM GENERATOR CHANGEOUT;  Surgeon: Constance Haw, MD;  Location: [facility];  Service: Cardiovascular;  Laterality: N/A;  . TONSILLECTOMY    . TOTAL HIP ARTHROPLASTY Right        Home Medications    Prior to Admission medications   Medication Sig Start Date End Date Taking? Authorizing Provider  allopurinol (ZYLOPRIM) 300 MG tablet Take 300 mg by mouth daily.    [provider]  aspirin EC 81 MG tablet Take 81 mg by mouth daily.    [provider]  B Complex Vitamins (VITAMIN B COMPLEX PO) Take 1 capsule by mouth daily.    [provider]  Calcium Carbonate-Vitamin D3 (CALCIUM 600-D) 600-400 MG-UNIT TABS Take 1 tablet by mouth 2 (two) times daily.    [provider]  finasteride (PROSCAR) 5 MG tablet Take 5 mg by mouth daily.    [provider]  ketotifen (ZADITOR) 0.025 % ophthalmic solution Place 1 drop into both eyes 2 (two) times daily.    [provider]  Multiple Vitamins-Minerals (ICAPS AREDS 2 PO) Take 1 capsule by mouth 2 (two) times daily.  [provider]  polyethylene glycol (MIRALAX / GLYCOLAX) packet Take 17 g by mouth daily as needed (for constipation). 12/16/16   Constance Haw, MD    Family History Family History  Problem Relation Age of Onset  . Cancer Mother   . Prostate cancer Father     Social History Social History  Substance Use Topics  . Smoking status: Former Smoker    Types: Cigarettes  . Smokeless tobacco: Never Used  . Alcohol use No     Allergies   Atenolol; Lisinopril; Nifedipine; Penicillins; Sulfa antibiotics; and Zocor [simvastatin]   Review of Systems Review of Systems  Constitutional: Negative for fever.  HENT: Negative for sore throat.   Eyes: Negative for visual disturbance.  Respiratory: Negative for shortness of breath.    Cardiovascular: Negative for chest pain and palpitations.  Gastrointestinal: Negative for abdominal pain.  Genitourinary: Negative for flank pain.  Musculoskeletal: Negative for back pain.  Skin: Positive for wound.  Neurological: Positive for headaches. Negative for weakness and numbness.  Hematological: Does not bruise/bleed easily.  Psychiatric/Behavioral: Negative for confusion.     Physical Exam Updated Vital Signs Ht 1.651 m (5\' 5" )   Wt 72.1 kg (159 lb)   SpO2 97%   BMI 26.46 kg/m   Physical Exam  Constitutional: He appears well-developed and well-nourished. No distress.  HENT:  Mouth/Throat: Oropharynx is clear and moist.  3 cm laceration to posterior scalp.  No fb seen or felt.   Eyes: Pupils are equal, round, and reactive to light. Conjunctivae are normal.  Neck: Neck supple. No tracheal deviation present.  Cardiovascular: Normal rate, normal heart sounds and intact distal pulses.   Pulmonary/Chest: Effort normal and breath sounds normal. No accessory muscle usage. No respiratory distress. He exhibits no tenderness.  Abdominal: Soft. He exhibits no distension. There is no tenderness.  Musculoskeletal: He exhibits no edema.  Mid cervical and lumbar tenderness, otherwise spine non tender. Spine aligned. No step off.  No other focal bony tenderness on bil extremity exam.   Neurological: He is alert.  Awake and alert, oriented. Speech clear/fluent. Motor intact bil. sens grossly intact.   Skin: Skin is warm and dry. He is not diaphoretic.  Psychiatric: He has a normal mood and affect.  Nursing note and vitals reviewed.    ED Treatments / Results  Labs (all labs ordered are listed, but only abnormal results are displayed) Labs Reviewed - No data to display  EKG  EKG Interpretation None       Radiology Dg Lumbar Spine Complete  Result Date: 12/26/2016 CLINICAL DATA:  Ground level fall, laceration to the back of his head. Low back pain. EXAM: LUMBAR SPINE -  COMPLETE 4+ VIEW COMPARISON:  12/01/2016 FINDINGS: Posterior decompression with instrumented fusion from L3 through the sacrum. Intact appearances of the hardware. Lumbar vertebrae are normal in height. No evidence of acute fracture. Sacrum and sacroiliac joints are not optimally seen but grossly intact. IMPRESSION: Negative for acute lumbar spine fracture. Electronically Signed   By: Andreas Newport M.D.   On: 12/26/2016 22:30   Ct Head Wo Contrast  Result Date: 12/26/2016 CLINICAL DATA:  Fall with laceration and posterior head and neck pain EXAM: CT HEAD WITHOUT CONTRAST CT CERVICAL SPINE WITHOUT CONTRAST TECHNIQUE: Multidetector CT imaging of the head and cervical spine was performed following the standard protocol without intravenous contrast. Multiplanar CT image reconstructions of the cervical spine were also generated. COMPARISON:  None. FINDINGS: CT HEAD FINDINGS Brain: No acute territorial  infarction, hemorrhage or intracranial mass is seen. Moderate atrophy. Mild small vessel ischemic changes of the white matter. Ventricle size within normal limits Vascular: No hyperdense vessels.  Carotid artery calcification Skull: Normal. Negative for fracture or focal lesion. Sinuses/Orbits: Mucosal thickening in the ethmoid and right frontal sinus. Fluid level in the left maxillary sinus. Postoperative change of the right maxillary sinus. No acute orbital abnormality Other: Small left posterior scalp laceration CT CERVICAL SPINE FINDINGS Alignment: Straightening of the cervical spine. No subluxation. Facet alignment is within normal limits Skull base and vertebrae: No acute fracture. No primary bone lesion or focal pathologic process. Soft tissues and spinal canal: No prevertebral fluid or swelling. No visible canal hematoma. Disc levels: Multilevel degenerative changes, moderate severe at C5-C6 and C6-C7. Multiple level bilateral foraminal stenosis. Upper chest: Negative. Other: None IMPRESSION: 1. No CT  evidence for acute intracranial abnormality. Atrophy and small vessel ischemic changes of the white matter. Small left posterior scalp laceration 2. Straightening of the cervical spine. No acute fracture or malalignment Electronically Signed   By: Donavan Foil M.D.   On: 12/26/2016 22:15   Ct Cervical Spine Wo Contrast  Result Date: 12/26/2016 CLINICAL DATA:  Fall with laceration and posterior head and neck pain EXAM: CT HEAD WITHOUT CONTRAST CT CERVICAL SPINE WITHOUT CONTRAST TECHNIQUE: Multidetector CT imaging of the head and cervical spine was performed following the standard protocol without intravenous contrast. Multiplanar CT image reconstructions of the cervical spine were also generated. COMPARISON:  None. FINDINGS: CT HEAD FINDINGS Brain: No acute territorial infarction, hemorrhage or intracranial mass is seen. Moderate atrophy. Mild small vessel ischemic changes of the white matter. Ventricle size within normal limits Vascular: No hyperdense vessels.  Carotid artery calcification Skull: Normal. Negative for fracture or focal lesion. Sinuses/Orbits: Mucosal thickening in the ethmoid and right frontal sinus. Fluid level in the left maxillary sinus. Postoperative change of the right maxillary sinus. No acute orbital abnormality Other: Small left posterior scalp laceration CT CERVICAL SPINE FINDINGS Alignment: Straightening of the cervical spine. No subluxation. Facet alignment is within normal limits Skull base and vertebrae: No acute fracture. No primary bone lesion or focal pathologic process. Soft tissues and spinal canal: No prevertebral fluid or swelling. No visible canal hematoma. Disc levels: Multilevel degenerative changes, moderate severe at C5-C6 and C6-C7. Multiple level bilateral foraminal stenosis. Upper chest: Negative. Other: None IMPRESSION: 1. No CT evidence for acute intracranial abnormality. Atrophy and small vessel ischemic changes of the white matter. Small left posterior scalp  laceration 2. Straightening of the cervical spine. No acute fracture or malalignment Electronically Signed   By: Donavan Foil M.D.   On: 12/26/2016 22:15    Procedures .Marland KitchenLaceration Repair Date/Time: 12/26/2016 10:46 PM Performed by: Lajean Saver Authorized by: Lajean Saver   Anesthesia (see MAR for exact dosages):    Anesthesia method:  Local infiltration   Local anesthetic:  Lidocaine 2% WITH epi Laceration details:    Location:  Scalp   Scalp location:  Occipital   Length (cm):  3 Repair type:    Repair type:  Simple Pre-procedure details:    Preparation:  Patient was prepped and draped in usual sterile fashion Treatment:    Area cleansed with:  Betadine   Amount of cleaning:  Standard   Irrigation solution:  Sterile saline   Visualized foreign bodies/material removed: no   Skin repair:    Repair method:  Sutures   Suture size:  4-0   Suture material:  Prolene   Suture  technique:  Simple interrupted Approximation:    V[place]ilion border: well-aligned   Post-procedure details:    Patient tolerance of procedure:  Tolerated well, no immediate complications   (including critical care time)  Medications Ordered in ED Medications  lidocaine-EPINEPHrine (XYLOCAINE W/EPI) 2 %-1:200000 (PF) injection 10 mL (not administered)     Initial Impression / Assessment and Plan / ED Course  I have reviewed the triage vital signs and the nursing notes.  Pertinent labs & imaging results that were available during my care of the patient were reviewed by me and considered in my medical decision making (see chart for details).  Imaging studies.  Wound sutured.   Acetaminophen po.  Reviewed nursing notes and prior charts for additional history.   Imaging negative acute.   Tetanus im.  Recheck spine nt.  Pt appears stable for d/c.     Final Clinical Impressions(s) / ED Diagnoses   Final diagnoses:  None    New Prescriptions New Prescriptions   No medications on file       Lajean Saver, MD 12/26/16 2247

## 2016-12-29 ENCOUNTER — Telehealth: Payer: Self-pay | Admitting: Cardiology

## 2016-12-29 NOTE — Telephone Encounter (Signed)
Informed pt that Dr. Curt Bears does not see pt as a PCP.  Advised to call the [facility] to see what they can do to help (they are listed as his PCP). Pt is agreeable to plan and thanks me for calling him back.

## 2016-12-29 NOTE — Telephone Encounter (Signed)
New message   Pt verbalized that he went to the ED and he has stiches in the left side of his head and   He said Dr.Camnitz is his only doctor he do not have a PCP to take them out  He wants rn to call home to see if he can come here to have them removed

## 2017-01-12 DIAGNOSIS — R2681 Unsteadiness on feet: Secondary | ICD-10-CM | POA: Diagnosis not present

## 2017-01-12 DIAGNOSIS — R262 Difficulty in walking, not elsewhere classified: Secondary | ICD-10-CM | POA: Diagnosis not present

## 2017-01-14 DIAGNOSIS — R262 Difficulty in walking, not elsewhere classified: Secondary | ICD-10-CM | POA: Diagnosis not present

## 2017-01-14 DIAGNOSIS — R2681 Unsteadiness on feet: Secondary | ICD-10-CM | POA: Diagnosis not present

## 2017-01-15 DIAGNOSIS — R262 Difficulty in walking, not elsewhere classified: Secondary | ICD-10-CM | POA: Diagnosis not present

## 2017-01-15 DIAGNOSIS — R2681 Unsteadiness on feet: Secondary | ICD-10-CM | POA: Diagnosis not present

## 2017-01-18 DIAGNOSIS — R2681 Unsteadiness on feet: Secondary | ICD-10-CM | POA: Diagnosis not present

## 2017-01-18 DIAGNOSIS — R262 Difficulty in walking, not elsewhere classified: Secondary | ICD-10-CM | POA: Diagnosis not present

## 2017-01-20 DIAGNOSIS — R2681 Unsteadiness on feet: Secondary | ICD-10-CM | POA: Diagnosis not present

## 2017-01-20 DIAGNOSIS — R262 Difficulty in walking, not elsewhere classified: Secondary | ICD-10-CM | POA: Diagnosis not present

## 2017-01-22 DIAGNOSIS — R262 Difficulty in walking, not elsewhere classified: Secondary | ICD-10-CM | POA: Diagnosis not present

## 2017-01-22 DIAGNOSIS — R2681 Unsteadiness on feet: Secondary | ICD-10-CM | POA: Diagnosis not present

## 2017-01-25 DIAGNOSIS — R262 Difficulty in walking, not elsewhere classified: Secondary | ICD-10-CM | POA: Diagnosis not present

## 2017-01-25 DIAGNOSIS — R2681 Unsteadiness on feet: Secondary | ICD-10-CM | POA: Diagnosis not present

## 2017-01-27 DIAGNOSIS — R262 Difficulty in walking, not elsewhere classified: Secondary | ICD-10-CM | POA: Diagnosis not present

## 2017-01-27 DIAGNOSIS — R2681 Unsteadiness on feet: Secondary | ICD-10-CM | POA: Diagnosis not present

## 2017-01-29 DIAGNOSIS — R262 Difficulty in walking, not elsewhere classified: Secondary | ICD-10-CM | POA: Diagnosis not present

## 2017-01-29 DIAGNOSIS — R2681 Unsteadiness on feet: Secondary | ICD-10-CM | POA: Diagnosis not present

## 2017-02-02 DIAGNOSIS — R2681 Unsteadiness on feet: Secondary | ICD-10-CM | POA: Diagnosis not present

## 2017-02-02 DIAGNOSIS — R262 Difficulty in walking, not elsewhere classified: Secondary | ICD-10-CM | POA: Diagnosis not present

## 2017-02-04 DIAGNOSIS — R2681 Unsteadiness on feet: Secondary | ICD-10-CM | POA: Diagnosis not present

## 2017-02-04 DIAGNOSIS — R262 Difficulty in walking, not elsewhere classified: Secondary | ICD-10-CM | POA: Diagnosis not present

## 2017-02-05 DIAGNOSIS — R262 Difficulty in walking, not elsewhere classified: Secondary | ICD-10-CM | POA: Diagnosis not present

## 2017-02-05 DIAGNOSIS — R2681 Unsteadiness on feet: Secondary | ICD-10-CM | POA: Diagnosis not present

## 2017-02-09 DIAGNOSIS — R262 Difficulty in walking, not elsewhere classified: Secondary | ICD-10-CM | POA: Diagnosis not present

## 2017-02-09 DIAGNOSIS — R2681 Unsteadiness on feet: Secondary | ICD-10-CM | POA: Diagnosis not present

## 2017-02-11 DIAGNOSIS — R262 Difficulty in walking, not elsewhere classified: Secondary | ICD-10-CM | POA: Diagnosis not present

## 2017-02-11 DIAGNOSIS — R2681 Unsteadiness on feet: Secondary | ICD-10-CM | POA: Diagnosis not present

## 2017-02-12 DIAGNOSIS — R262 Difficulty in walking, not elsewhere classified: Secondary | ICD-10-CM | POA: Diagnosis not present

## 2017-02-12 DIAGNOSIS — R2681 Unsteadiness on feet: Secondary | ICD-10-CM | POA: Diagnosis not present

## 2017-02-16 DIAGNOSIS — R262 Difficulty in walking, not elsewhere classified: Secondary | ICD-10-CM | POA: Diagnosis not present

## 2017-02-16 DIAGNOSIS — R2681 Unsteadiness on feet: Secondary | ICD-10-CM | POA: Diagnosis not present

## 2017-02-17 DIAGNOSIS — R2681 Unsteadiness on feet: Secondary | ICD-10-CM | POA: Diagnosis not present

## 2017-02-17 DIAGNOSIS — R262 Difficulty in walking, not elsewhere classified: Secondary | ICD-10-CM | POA: Diagnosis not present

## 2017-02-19 DIAGNOSIS — R2681 Unsteadiness on feet: Secondary | ICD-10-CM | POA: Diagnosis not present

## 2017-02-19 DIAGNOSIS — R262 Difficulty in walking, not elsewhere classified: Secondary | ICD-10-CM | POA: Diagnosis not present

## 2017-02-22 DIAGNOSIS — R262 Difficulty in walking, not elsewhere classified: Secondary | ICD-10-CM | POA: Diagnosis not present

## 2017-02-22 DIAGNOSIS — R2681 Unsteadiness on feet: Secondary | ICD-10-CM | POA: Diagnosis not present

## 2017-02-24 DIAGNOSIS — R2681 Unsteadiness on feet: Secondary | ICD-10-CM | POA: Diagnosis not present

## 2017-02-24 DIAGNOSIS — R262 Difficulty in walking, not elsewhere classified: Secondary | ICD-10-CM | POA: Diagnosis not present

## 2017-02-26 DIAGNOSIS — R262 Difficulty in walking, not elsewhere classified: Secondary | ICD-10-CM | POA: Diagnosis not present

## 2017-02-26 DIAGNOSIS — R2681 Unsteadiness on feet: Secondary | ICD-10-CM | POA: Diagnosis not present

## 2017-03-02 DIAGNOSIS — R2681 Unsteadiness on feet: Secondary | ICD-10-CM | POA: Diagnosis not present

## 2017-03-02 DIAGNOSIS — R262 Difficulty in walking, not elsewhere classified: Secondary | ICD-10-CM | POA: Diagnosis not present

## 2017-03-03 DIAGNOSIS — R2681 Unsteadiness on feet: Secondary | ICD-10-CM | POA: Diagnosis not present

## 2017-03-03 DIAGNOSIS — R262 Difficulty in walking, not elsewhere classified: Secondary | ICD-10-CM | POA: Diagnosis not present

## 2017-03-04 DIAGNOSIS — R2681 Unsteadiness on feet: Secondary | ICD-10-CM | POA: Diagnosis not present

## 2017-03-04 DIAGNOSIS — R262 Difficulty in walking, not elsewhere classified: Secondary | ICD-10-CM | POA: Diagnosis not present

## 2017-03-09 DIAGNOSIS — R2681 Unsteadiness on feet: Secondary | ICD-10-CM | POA: Diagnosis not present

## 2017-03-09 DIAGNOSIS — R262 Difficulty in walking, not elsewhere classified: Secondary | ICD-10-CM | POA: Diagnosis not present

## 2017-03-10 DIAGNOSIS — R262 Difficulty in walking, not elsewhere classified: Secondary | ICD-10-CM | POA: Diagnosis not present

## 2017-03-10 DIAGNOSIS — R2681 Unsteadiness on feet: Secondary | ICD-10-CM | POA: Diagnosis not present

## 2017-03-12 DIAGNOSIS — R2681 Unsteadiness on feet: Secondary | ICD-10-CM | POA: Diagnosis not present

## 2017-03-12 DIAGNOSIS — R262 Difficulty in walking, not elsewhere classified: Secondary | ICD-10-CM | POA: Diagnosis not present

## 2017-03-16 DIAGNOSIS — R2681 Unsteadiness on feet: Secondary | ICD-10-CM | POA: Diagnosis not present

## 2017-03-16 DIAGNOSIS — R262 Difficulty in walking, not elsewhere classified: Secondary | ICD-10-CM | POA: Diagnosis not present

## 2017-03-17 DIAGNOSIS — R262 Difficulty in walking, not elsewhere classified: Secondary | ICD-10-CM | POA: Diagnosis not present

## 2017-03-17 DIAGNOSIS — R2681 Unsteadiness on feet: Secondary | ICD-10-CM | POA: Diagnosis not present

## 2017-03-22 ENCOUNTER — Encounter: Payer: Self-pay | Admitting: Cardiology

## 2017-03-22 ENCOUNTER — Ambulatory Visit (INDEPENDENT_AMBULATORY_CARE_PROVIDER_SITE_OTHER): Payer: Medicare Other | Admitting: Cardiology

## 2017-03-22 VITALS — BP 152/84 | HR 71 | Ht 65.0 in | Wt 163.0 lb

## 2017-03-22 DIAGNOSIS — Z45018 Encounter for adjustment and management of other part of cardiac pacemaker: Secondary | ICD-10-CM | POA: Diagnosis not present

## 2017-03-22 DIAGNOSIS — I480 Paroxysmal atrial fibrillation: Secondary | ICD-10-CM | POA: Diagnosis not present

## 2017-03-22 DIAGNOSIS — I10 Essential (primary) hypertension: Secondary | ICD-10-CM | POA: Diagnosis not present

## 2017-03-22 DIAGNOSIS — I495 Sick sinus syndrome: Secondary | ICD-10-CM

## 2017-03-22 LAB — CUP PACEART INCLINIC DEVICE CHECK
Battery Remaining Longevity: 103 mo
Battery Voltage: 3.14 V
Brady Statistic RV Percent Paced: 98.99 %
Date Time Interrogation Session: 20181126143223
Implantable Lead Implant Date: 19941122
Implantable Lead Location: 753859
Implantable Lead Location: 753860
Implantable Lead Model: 5076
Implantable Pulse Generator Implant Date: 20180806
Lead Channel Impedance Value: 190 Ohm
Lead Channel Pacing Threshold Amplitude: 0.5 V
Lead Channel Pacing Threshold Pulse Width: 0.4 ms
Lead Channel Sensing Intrinsic Amplitude: 3 mV
Lead Channel Setting Sensing Sensitivity: 4 mV
MDC IDC LEAD IMPLANT DT: 20020426
MDC IDC MSMT LEADCHNL RA IMPEDANCE VALUE: 285 Ohm
MDC IDC MSMT LEADCHNL RA IMPEDANCE VALUE: 361 Ohm
MDC IDC MSMT LEADCHNL RA SENSING INTR AMPL: 2.875 mV
MDC IDC MSMT LEADCHNL RV IMPEDANCE VALUE: 190 Ohm
MDC IDC MSMT LEADCHNL RV PACING THRESHOLD AMPLITUDE: 1.25 V
MDC IDC MSMT LEADCHNL RV PACING THRESHOLD PULSEWIDTH: 0.4 ms
MDC IDC MSMT LEADCHNL RV SENSING INTR AMPL: 6 mV
MDC IDC MSMT LEADCHNL RV SENSING INTR AMPL: 6 mV
MDC IDC SET LEADCHNL RA PACING AMPLITUDE: 2 V
MDC IDC SET LEADCHNL RV PACING AMPLITUDE: 2.5 V
MDC IDC SET LEADCHNL RV PACING PULSEWIDTH: 0.4 ms
MDC IDC STAT BRADY AP VP PERCENT: 28.3 %
MDC IDC STAT BRADY AP VS PERCENT: 0.02 %
MDC IDC STAT BRADY AS VP PERCENT: 70.69 %
MDC IDC STAT BRADY AS VS PERCENT: 0.99 %
MDC IDC STAT BRADY RA PERCENT PACED: 28.15 %

## 2017-03-22 NOTE — Patient Instructions (Signed)
Medication Instructions:  Your physician recommends that you continue on your current medications as directed. Please refer to the Current Medication list given to you today.  *If you need a refill on your cardiac medications before your next appointment, please call your pharmacy*  Labwork: None ordered  Testing/Procedures: None ordered  Follow-Up: Remote monitoring is used to monitor your Pacemaker or ICD from home. This monitoring reduces the number of office visits required to check your device to one time per year. It allows [place] to keep an eye on the functioning of your device to ensure it is working properly. You are scheduled for a device check from home on 06/21/2017. You may send your transmission at any time that day. If you have a wireless device, the transmission will be sent automatically. After your physician reviews your transmission, you will receive a postcard with your next transmission date.  Your physician wants you to follow-up in: 1 year with Dr. Curt Bears.  You will receive a reminder letter in the mail two months in advance. If you don't receive a letter, please call our office to schedule the follow-up appointment.  Thank you for choosing [facility]!!   Trinidad Curet, RN [phone]

## 2017-03-22 NOTE — Progress Notes (Signed)
Electrophysiology Office Note   Date:  03/22/2017   ID:  Felicia, Both [birth year], MRN [id number]  PCP:  [facility] Primary Electrophysiologist:  Cambryn Charters Meredith Leeds, MD    Chief Complaint  Patient presents with  . Pacemaker Check    Sick sinus syndrome     History of Present Illness: Eugene Miller is a 83 y.o. male who is being seen today for the evaluation of sick sinus syndrome at the request of No ref. provider found. Presenting today for electrophysiology evaluation. He has history of hypertension, paroxysmal atrial fibrillation, sick sinus syndrome, and has a pacemaker which was initially implanted on 08/20/2000. He recently relocated to [place] from [place] to be closer to his family. Had pacemaker generator change 05/02/58 complicated by hematoma.  Today, denies symptoms of palpitations, chest pain, shortness of breath, orthopnea, PND, lower extremity edema, claudication, dizziness, presyncope, syncope, bleeding, or neurologic sequela. The patient is tolerating medications without difficulties.    Of note, he fought in World War II in [place] and [place].   Past Medical History:  Diagnosis Date  . A-fib ([place])   . Aortic regurgitation   . Arthritis   . Basal cell carcinoma (BCC) of nostril   . Cardiac disease   . Deaf   . Gout   . Hypertension   . Lumbago   . Migraine headache   . Mitral regurgitation   . Sinusitis   . Spinal stenosis   . SSS (sick sinus syndrome) ([place])   . Tricuspid regurgitation   . Vertigo    Past Surgical History:  Procedure Laterality Date  . COLONOSCOPY W/ POLYPECTOMY    . EP IMPLANTABLE DEVICE     [id number], 2002,  . INSERT / REPLACE / REMOVE PACEMAKER    . LAMINECTOMY    . NASAL SINUS SURGERY    . NOSE SURGERY     REMOVAL OF BASAL CELL CA  . PPM GENERATOR CHANGEOUT  02/2008  . PPM GENERATOR CHANGEOUT N/A 11/30/2016   Procedure: PPM GENERATOR CHANGEOUT;  Surgeon: Constance Haw, MD;  Location: [facility];  Service: Cardiovascular;  Laterality: N/A;  . TONSILLECTOMY    . TOTAL HIP ARTHROPLASTY Right      Current Outpatient Medications  Medication Sig Dispense Refill  . allopurinol (ZYLOPRIM) 300 MG tablet Take 300 mg by mouth daily.    Marland Kitchen aspirin EC 81 MG tablet Take 81 mg by mouth daily.    . B Complex Vitamins (VITAMIN B COMPLEX PO) Take 1 capsule by mouth daily.    . Calcium Carbonate-Vitamin D3 (CALCIUM 600-D) 600-400 MG-UNIT TABS Take 1 tablet by mouth 2 (two) times daily.    . finasteride (PROSCAR) 5 MG tablet Take 5 mg by mouth at bedtime.     . Glycerin-Hypromellose-PEG 400 (SM DRY EYE RELIEF OP) Place 1 drop into both eyes as needed.    Marland Kitchen ketotifen (ZADITOR) 0.025 % ophthalmic solution Place 1 drop into both eyes 2 (two) times daily.    . Multiple Vitamins-Minerals (ICAPS AREDS 2 PO) Take 1 capsule by mouth 2 (two) times daily.    . polyethylene glycol (MIRALAX / GLYCOLAX) packet Take 17 g by mouth daily as needed (for constipation). 14 each 0   No current facility-administered medications for this visit.     Allergies:   Atenolol; Lisinopril; Nifedipine; Penicillins; Sulfa antibiotics; and Zocor [simvastatin]   Social History:  The patient  reports that he has quit smoking. His smoking use  included cigarettes. he has never used smokeless tobacco. He reports that he does not drink alcohol or use drugs.   Family History:  The patient's family history includes Cancer in his mother; Prostate cancer in his father.   ROS:  Please see the history of present illness.   Otherwise, review of systems is positive for hearing loss, back pain, balance problems, dizziness.   All other systems are reviewed and negative.   PHYSICAL EXAM: VS:  BP (!) 152/84   Pulse 71   Ht 5\' 5"  (1.651 m)   Wt 163 lb (73.9 kg)   BMI 27.12 kg/m  , BMI Body mass index is 27.12 kg/m. GEN: Well nourished, well developed, in no acute distress  HEENT: normal  Neck: no JVD, carotid bruits, or  masses Cardiac: RRR; no murmurs, rubs, or gallops,no edema  Respiratory:  clear to auscultation bilaterally, normal work of breathing GI: soft, nontender, nondistended, + BS MS: no deformity or atrophy  Skin: warm and dry, device site well healed Neuro:  Strength and sensation are intact Psych: euthymic mood, full affect  EKG:  EKG is ordered today. Personal review of the ekg ordered shows AV paced  Personal review of the device interrogation today. Results in [place]: 11/30/2016: B Natriuretic Peptide 502.6; TSH 3.691 12/01/2016: ALT 27; BUN 25; Creatinine, Ser 1.76; Hemoglobin 12.6; Platelets 178; Potassium 3.5; Sodium 130    Lipid Panel  No results found for: CHOL, TRIG, HDL, CHOLHDL, VLDL, LDLCALC, LDLDIRECT   Wt Readings from Last 3 Encounters:  03/22/17 163 lb (73.9 kg)  12/26/16 159 lb (72.1 kg)  12/16/16 159 lb 12.8 oz (72.5 kg)      Other studies Reviewed: Additional studies/ records that were reviewed today include: prior cardiology records, TTE 2011  Review of the above records today demonstrates:  Ejection fraction 45-50%, LV in her septum mildly hypokinetic. Normal size left atrium aortic sclerosis present mild to moderate aortic regurgitation. Mild mitral regurgitation. RV systolic pressure 14.4 mmHg.  SPECT 2011 P.m. defect in the inferior wall not reversible consistent with attenuation artifact, ejection fraction 60%  ASSESSMENT AND PLAN:  1.  Atrial fibrillation: In sinus rhythm currently.  Had generator change 11/30/16.  Had no further episodes of atrial fibrillation seen on his device.  Eugene Miller hold off on anticoagulation at this time.  Currently not anticoagulated.    This patients CHA2DS2-VASc Score and unadjusted Ischemic Stroke Rate (% per year) is equal to 3.2 % stroke rate/year from a score of 3  Above score calculated as 1 point each if present [CHF, HTN, DM, Vascular=MI/PAD/Aortic Plaque, Age if 65-74, or Male] Above score calculated  as 2 points each if present [Age > 75, or Stroke/TIA/TE]   2. Sick sinus syndrome: Greater change 11/30/16.  No comp located by hematoma.  Hematoma has improved.  Doing well.  No changes.  3. Hypertension: Mildly elevated today but has been more normal in the past.  I feel that his elevated blood pressures are not a huge risks to him due to his advanced age and dropping his blood pressure would be at higher risk.  We Eugene Miller make no further changes.   Current medicines are reviewed at length with the patient today.   The patient does not have concerns regarding his medicines.  The following changes were made today:  none  Labs/ tests ordered today include:  Orders Placed This Encounter  Procedures  . EKG 12-Lead     Disposition:  FU with Eugene Miller 12 months  Signed, Eugene Miller Meredith Leeds, MD  03/22/2017 11:55 AM     [facility] [address] [place] [address] [phone] (office) [phone] (fax)

## 2017-03-23 ENCOUNTER — Encounter: Payer: Medicare Other | Admitting: Cardiology

## 2017-03-24 ENCOUNTER — Telehealth: Payer: Self-pay | Admitting: Cardiology

## 2017-03-24 ENCOUNTER — Other Ambulatory Visit: Payer: Self-pay

## 2017-03-24 ENCOUNTER — Encounter: Payer: Self-pay | Admitting: *Deleted

## 2017-03-24 ENCOUNTER — Emergency Department (HOSPITAL_COMMUNITY): Payer: Medicare Other

## 2017-03-24 ENCOUNTER — Encounter (HOSPITAL_COMMUNITY): Payer: Self-pay | Admitting: *Deleted

## 2017-03-24 ENCOUNTER — Emergency Department (HOSPITAL_COMMUNITY)
Admission: EM | Admit: 2017-03-24 | Discharge: 2017-03-24 | Disposition: A | Discharge status: Home / Self Care | Payer: Medicare Other | Attending: Emergency Medicine | Admitting: Emergency Medicine

## 2017-03-24 DIAGNOSIS — I110 Hypertensive heart disease with heart failure: Secondary | ICD-10-CM | POA: Insufficient documentation

## 2017-03-24 DIAGNOSIS — J90 Pleural effusion, not elsewhere classified: Secondary | ICD-10-CM | POA: Diagnosis not present

## 2017-03-24 DIAGNOSIS — R0601 Orthopnea: Secondary | ICD-10-CM | POA: Insufficient documentation

## 2017-03-24 DIAGNOSIS — Z96641 Presence of right artificial hip joint: Secondary | ICD-10-CM | POA: Diagnosis not present

## 2017-03-24 DIAGNOSIS — Z87891 Personal history of nicotine dependence: Secondary | ICD-10-CM | POA: Diagnosis not present

## 2017-03-24 DIAGNOSIS — Z7982 Long term (current) use of aspirin: Secondary | ICD-10-CM | POA: Insufficient documentation

## 2017-03-24 DIAGNOSIS — I509 Heart failure, unspecified: Secondary | ICD-10-CM | POA: Diagnosis not present

## 2017-03-24 DIAGNOSIS — I10 Essential (primary) hypertension: Secondary | ICD-10-CM | POA: Diagnosis not present

## 2017-03-24 DIAGNOSIS — R030 Elevated blood-pressure reading, without diagnosis of hypertension: Secondary | ICD-10-CM | POA: Diagnosis not present

## 2017-03-24 DIAGNOSIS — Z79899 Other long term (current) drug therapy: Secondary | ICD-10-CM | POA: Diagnosis not present

## 2017-03-24 LAB — CBC WITH DIFFERENTIAL/PLATELET
Basophils Absolute: 0 10*3/uL (ref 0.0–0.1)
Basophils Relative: 0 %
Eosinophils Absolute: 0.1 10*3/uL (ref 0.0–0.7)
Eosinophils Relative: 1 %
HEMATOCRIT: 42.1 % (ref 39.0–52.0)
HEMOGLOBIN: 13.9 g/dL (ref 13.0–17.0)
LYMPHS ABS: 0.9 10*3/uL (ref 0.7–4.0)
Lymphocytes Relative: 11 %
MCH: 31.2 pg (ref 26.0–34.0)
MCHC: 33 g/dL (ref 30.0–36.0)
MCV: 94.4 fL (ref 78.0–100.0)
MONOS PCT: 9 %
Monocytes Absolute: 0.7 10*3/uL (ref 0.1–1.0)
NEUTROS ABS: 6.3 10*3/uL (ref 1.7–7.7)
NEUTROS PCT: 79 %
Platelets: 193 10*3/uL (ref 150–400)
RBC: 4.46 MIL/uL (ref 4.22–5.81)
RDW: 14.7 % (ref 11.5–15.5)
WBC: 8 10*3/uL (ref 4.0–10.5)

## 2017-03-24 LAB — BASIC METABOLIC PANEL
Anion gap: 8 (ref 5–15)
BUN: 24 mg/dL — AB (ref 6–20)
CALCIUM: 9.4 mg/dL (ref 8.9–10.3)
CO2: 25 mmol/L (ref 22–32)
CREATININE: 1.44 mg/dL — AB (ref 0.61–1.24)
Chloride: 103 mmol/L (ref 101–111)
GFR calc Af Amer: 46 mL/min — ABNORMAL LOW (ref >60)
GFR, EST NON AFRICAN AMERICAN: 40 mL/min — AB (ref >60)
GLUCOSE: 129 mg/dL — AB (ref 65–99)
Potassium: 3.9 mmol/L (ref 3.5–5.1)
SODIUM: 136 mmol/L (ref 135–145)

## 2017-03-24 NOTE — Telephone Encounter (Signed)
New message    Patient daughter Butch Penny calling; wants to know details of last visit. She states she is taking the patient to the hospital today with blood pressure concerns. Please call [phone].

## 2017-03-24 NOTE — ED Notes (Signed)
Pt to xray

## 2017-03-24 NOTE — ED Notes (Signed)
ED Provider at bedside. 

## 2017-03-24 NOTE — ED Triage Notes (Signed)
The pt has had higher bp since yesterday  He has 3 bp checks that are only sl elevared  He could not sleep for some reason  So he came here today  He denies pain anywhere

## 2017-03-24 NOTE — Discharge Instructions (Signed)
Get help right away if:  You develop a severe headache or confusion.  You have unusual weakness or numbness.  You feel faint.  You have severe pain in your chest or abdomen.  You vomit repeatedly.  You have trouble breathing.

## 2017-03-24 NOTE — Telephone Encounter (Signed)
Informed dtr of pt's BPs being this same average since we first started seeing pt in June of this year.   Explained that normally Dr. Curt Bears does not follow BPs, but will address issue if elevated enough in clinic.  Also explained Dr. Curt Bears last note about pt's elevated BP. She thanks me for helping explain and I will send her BP readings, that we have in our system, since June in case it will help them determine best course of action for the patient. She thanks me for discussing/informing her and being so helpful.

## 2017-03-24 NOTE — ED Provider Notes (Signed)
[facility] EMERGENCY DEPARTMENT Provider Note   CSN: [id number] Arrival date & time: 03/24/17  1607     History   Chief Complaint Chief Complaint  Patient presents with  . Hypertension    HPI Eugene Miller is a 83 y.o. male who presents the emergency department brought in by his daughter for hypertension and orthopnea.  Patient has a history of CHF, A. fib, aortic regurg, sick sinus syndrome, tricuspid regurg, and insertion of pacemaker.  Patient states that for the past few nights he has had episodes of orthopnea.  He states that this is terrifying at night when he lives alone.  He states that it only happens when he lies flat and improves when he sits up.  He occasionally wakes up gasping for air.  He has noticed more peripheral edema.  Patient has not taken his Lasix since Tuesday, 03/16/2017.  Patient is also worried because his blood pressure reading was 152/84 yesterday and this morning 148/82.  He is concerned that his blood pressure is elevated.  His daughter who is at bedside states that Dr. Curt Bears saw him and does not feel that he needs to have his blood pressure medications started especially at his age.  Patient denies chest pain and has no other symptoms.  He denies any new meds. His amlodipine was increased recently. HPI  Past Medical History:  Diagnosis Date  . A-fib ([place])   . Aortic regurgitation   . Arthritis   . Basal cell carcinoma (BCC) of nostril   . Cardiac disease   . Deaf   . Gout   . Hypertension   . Lumbago   . Migraine headache   . Mitral regurgitation   . Sinusitis   . Spinal stenosis   . SSS (sick sinus syndrome) ([place])   . Tricuspid regurgitation   . Vertigo     Patient Active Problem List   Diagnosis Date Noted  . CHF exacerbation ([place]) 11/30/2016  . Hyponatremia 11/30/2016  . Anemia 11/30/2016  . Renal insufficiency 11/30/2016  . Edema 11/30/2016  . Gout 11/30/2016  . History of sick sinus syndrome 11/30/2016  . CHF  (congestive heart failure) ([place]) 11/30/2016  . Complete heart block ([place])   . Paroxysmal atrial fibrillation ([place]) 10/20/2016  . Sick sinus syndrome ([place]) 10/20/2016  . Essential hypertension 10/20/2016    Past Surgical History:  Procedure Laterality Date  . COLONOSCOPY W/ POLYPECTOMY    . EP IMPLANTABLE DEVICE     [id number], 2002,  . INSERT / REPLACE / REMOVE PACEMAKER    . LAMINECTOMY    . NASAL SINUS SURGERY    . NOSE SURGERY     REMOVAL OF BASAL CELL CA  . PPM GENERATOR CHANGEOUT  02/2008  . PPM GENERATOR CHANGEOUT N/A 11/30/2016   Procedure: PPM GENERATOR CHANGEOUT;  Surgeon: Constance Haw, MD;  Location: [facility];  Service: Cardiovascular;  Laterality: N/A;  . TONSILLECTOMY    . TOTAL HIP ARTHROPLASTY Right        Home Medications    Prior to Admission medications   Medication Sig Start Date End Date Taking? Authorizing Provider  allopurinol (ZYLOPRIM) 300 MG tablet Take 300 mg by mouth daily.    [provider]  aspirin EC 81 MG tablet Take 81 mg by mouth daily.    [provider]  B Complex Vitamins (VITAMIN B COMPLEX PO) Take 1 capsule by mouth daily.    [provider]  Calcium Carbonate-Vitamin  D3 (CALCIUM 600-D) 600-400 MG-UNIT TABS Take 1 tablet by mouth 2 (two) times daily.    [provider]  finasteride (PROSCAR) 5 MG tablet Take 5 mg by mouth at bedtime.     [provider]  Glycerin-Hypromellose-PEG 400 (SM DRY EYE RELIEF OP) Place 1 drop into both eyes as needed.    [provider]  ketotifen (ZADITOR) 0.025 % ophthalmic solution Place 1 drop into both eyes 2 (two) times daily.    [provider]  Multiple Vitamins-Minerals (ICAPS AREDS 2 PO) Take 1 capsule by mouth 2 (two) times daily.    [provider]  polyethylene glycol (MIRALAX / GLYCOLAX) packet Take 17 g by mouth daily as needed (for constipation). 12/16/16   Constance Haw, MD    Family History Family  History  Problem Relation Age of Onset  . Cancer Mother   . Prostate cancer Father     Social History Social History   Tobacco Use  . Smoking status: Former Smoker    Types: Cigarettes  . Smokeless tobacco: Never Used  Substance Use Topics  . Alcohol use: No  . Drug use: No     Allergies   Atenolol; Lisinopril; Nifedipine; Penicillins; Sulfa antibiotics; and Zocor [simvastatin]   Review of Systems Review of Systems  Ten systems reviewed and are negative for acute change, except as noted in the HPI.   Physical Exam Updated Vital Signs BP (!) 151/93 (BP Location: Right Arm)   Pulse 84   Temp 98 F (36.7 C) (Oral)   Resp 14   Ht 5\' 3"  (1.6 m)   Wt 74.8 kg (165 lb)   SpO2 96%   BMI 29.23 kg/m   Physical Exam  Constitutional: He appears well-developed and well-nourished. No distress.  HENT:  Head: Normocephalic and atraumatic.  Eyes: Conjunctivae are normal. No scleral icterus.  Neck: Normal range of motion. Neck supple.  Cardiovascular: Normal rate, regular rhythm, normal heart sounds and intact distal pulses.  #+ pitting edema BL lower extremities  Pulmonary/Chest: Effort normal and breath sounds normal. No stridor. No respiratory distress. He has no rales.  Abdominal: Soft. There is no tenderness.  Musculoskeletal: Normal range of motion. He exhibits no edema.  Neurological: He is alert.  Skin: Skin is warm and dry. He is not diaphoretic.  Psychiatric: His behavior is normal.  Nursing note and vitals reviewed.    ED Treatments / Results  Labs (all labs ordered are listed, but only abnormal results are displayed) Labs Reviewed - No data to display  EKG  EKG Interpretation None       Radiology No results found.  Procedures Procedures (including critical care time)  Medications Ordered in ED Medications - No data to display   Initial Impression / Assessment and Plan / ED Course  I have reviewed the triage vital signs and the nursing  notes.  Pertinent labs & imaging results that were available during my care of the patient were reviewed by me and considered in my medical decision making (see chart for details).     Patient with pending workup for CHF.  Discussed the plan of care with Dr. Alvino Chapel who will assume care of the patient for disposition.  Final Clinical Impressions(s) / ED Diagnoses   Final diagnoses:  Elevated blood pressure reading  Orthopnea    ED Discharge Orders    None       Margarita Mail, PA-C 03/25/17 1737    Davonna Belling, MD 03/26/17 [phone]

## 2017-04-06 ENCOUNTER — Ambulatory Visit: Payer: Medicare Other | Admitting: Family Medicine

## 2017-04-07 ENCOUNTER — Ambulatory Visit: Payer: Medicare Other | Admitting: Family Medicine

## 2017-04-16 DIAGNOSIS — H109 Unspecified conjunctivitis: Secondary | ICD-10-CM | POA: Diagnosis not present

## 2017-04-18 ENCOUNTER — Other Ambulatory Visit: Payer: Self-pay

## 2017-04-18 ENCOUNTER — Emergency Department (HOSPITAL_COMMUNITY)
Admission: EM | Admit: 2017-04-18 | Discharge: 2017-04-18 | Disposition: A | Discharge status: Home / Self Care | Payer: Medicare Other | Attending: Emergency Medicine | Admitting: Emergency Medicine

## 2017-04-18 ENCOUNTER — Encounter (HOSPITAL_COMMUNITY): Payer: Self-pay | Admitting: Emergency Medicine

## 2017-04-18 DIAGNOSIS — Z7982 Long term (current) use of aspirin: Secondary | ICD-10-CM | POA: Diagnosis not present

## 2017-04-18 DIAGNOSIS — I509 Heart failure, unspecified: Secondary | ICD-10-CM | POA: Insufficient documentation

## 2017-04-18 DIAGNOSIS — Z79899 Other long term (current) drug therapy: Secondary | ICD-10-CM | POA: Insufficient documentation

## 2017-04-18 DIAGNOSIS — R3911 Hesitancy of micturition: Secondary | ICD-10-CM | POA: Diagnosis not present

## 2017-04-18 DIAGNOSIS — I442 Atrioventricular block, complete: Secondary | ICD-10-CM | POA: Insufficient documentation

## 2017-04-18 DIAGNOSIS — Z87891 Personal history of nicotine dependence: Secondary | ICD-10-CM | POA: Diagnosis not present

## 2017-04-18 DIAGNOSIS — I480 Paroxysmal atrial fibrillation: Secondary | ICD-10-CM | POA: Diagnosis not present

## 2017-04-18 DIAGNOSIS — R300 Dysuria: Secondary | ICD-10-CM | POA: Diagnosis not present

## 2017-04-18 DIAGNOSIS — I110 Hypertensive heart disease with heart failure: Secondary | ICD-10-CM | POA: Insufficient documentation

## 2017-04-18 DIAGNOSIS — R030 Elevated blood-pressure reading, without diagnosis of hypertension: Secondary | ICD-10-CM | POA: Diagnosis not present

## 2017-04-18 LAB — URINALYSIS, ROUTINE W REFLEX MICROSCOPIC
Bilirubin Urine: NEGATIVE
Glucose, UA: NEGATIVE mg/dL
Hgb urine dipstick: NEGATIVE
Ketones, ur: NEGATIVE mg/dL
Leukocytes, UA: NEGATIVE
Nitrite: NEGATIVE
Protein, ur: NEGATIVE mg/dL
Specific Gravity, Urine: 1.009 (ref 1.005–1.030)
pH: 6 (ref 5.0–8.0)

## 2017-04-18 MED ORDER — TAMSULOSIN HCL 0.4 MG PO CAPS: 0.4000 mg | ORAL_CAPSULE | Freq: Every day | ORAL | 0 refills | Status: AC

## 2017-04-18 NOTE — Discharge Instructions (Signed)
Please read attached information. If you experience any new or worsening signs or symptoms please return to the emergency room for evaluation. Please follow-up with your primary care provider or specialist as discussed. Please use medication prescribed only as directed and discontinue taking if you have any concerning signs or symptoms.   °

## 2017-04-18 NOTE — ED Notes (Signed)
Pt getting dressed and waiting for PTAR.

## 2017-04-18 NOTE — ED Notes (Signed)
Patient urinated approx 100 ml into urinal. Bladder scanned afterwards. 74 ml remaining.

## 2017-04-18 NOTE — ED Notes (Signed)
Report called to [facility]. Spoke with Linton Rump. Patient to be discharged, and transported back to facility via [facility].

## 2017-04-18 NOTE — ED Triage Notes (Addendum)
Patient arrived to ED via [facility] from [facility]. EMS reports:  Patient c/o difficulty urinating x 2 days. States it takes 20 minutes to urinate which is abnormal for patient. Denies pain with urination or hematuria.   Patient reports he did not take his Lasix 20 mg today.

## 2017-04-18 NOTE — ED Provider Notes (Signed)
[facility] EMERGENCY DEPARTMENT Provider Note   CSN: [id number] Arrival date & time: 04/18/17  1145     History   Chief Complaint Chief Complaint  Patient presents with  . Urinary hesitancy    HPI Eugene Miller is a 83 y.o. male.  HPI   83 year old male presents today with complaints of urinary frequency and hesitancy.  Patient reports that over the last 2 nights he is had to use the restroom numerous times throughout the night.  He notes approximately every hour he has to get up to go to the bathroom and urinates a very small amount.  He notes he has a very weak stream, denies any dysuria, pain.  Denies any abdominal pain, pelvic pain, fever or chills.  He denies any history of the same.  Patient reports he goes to the [facility], but does not have a urologist.  Past Medical History:  Diagnosis Date  . A-fib ([place])   . Aortic regurgitation   . Arthritis   . Basal cell carcinoma (BCC) of nostril   . Cardiac disease   . Deaf   . Gout   . Hypertension   . Lumbago   . Migraine headache   . Mitral regurgitation   . Sinusitis   . Spinal stenosis   . SSS (sick sinus syndrome) ([place])   . Tricuspid regurgitation   . Vertigo     Patient Active Problem List   Diagnosis Date Noted  . CHF exacerbation ([place]) 11/30/2016  . Hyponatremia 11/30/2016  . Anemia 11/30/2016  . Renal insufficiency 11/30/2016  . Edema 11/30/2016  . Gout 11/30/2016  . History of sick sinus syndrome 11/30/2016  . CHF (congestive heart failure) ([place]) 11/30/2016  . Complete heart block ([place])   . Paroxysmal atrial fibrillation ([place]) 10/20/2016  . Sick sinus syndrome ([place]) 10/20/2016  . Essential hypertension 10/20/2016    Past Surgical History:  Procedure Laterality Date  . COLONOSCOPY W/ POLYPECTOMY    . EP IMPLANTABLE DEVICE     [id number], 2002,  . INSERT / REPLACE / REMOVE PACEMAKER    . LAMINECTOMY    . NASAL SINUS SURGERY    . NOSE SURGERY     REMOVAL OF BASAL CELL CA  . PPM  GENERATOR CHANGEOUT  02/2008  . PPM GENERATOR CHANGEOUT N/A 11/30/2016   Procedure: PPM GENERATOR CHANGEOUT;  Surgeon: Constance Haw, MD;  Location: [facility];  Service: Cardiovascular;  Laterality: N/A;  . TONSILLECTOMY    . TOTAL HIP ARTHROPLASTY Right        Home Medications    Prior to Admission medications   Medication Sig Start Date End Date Taking? Authorizing Provider  allopurinol (ZYLOPRIM) 300 MG tablet Take 300 mg by mouth daily.    [provider]  aspirin EC 81 MG tablet Take 81 mg by mouth daily.    [provider]  B Complex Vitamins (VITAMIN B COMPLEX PO) Take 1 capsule by mouth daily.    [provider]  Calcium Carbonate-Vitamin D3 (CALCIUM 600-D) 600-400 MG-UNIT TABS Take 1 tablet by mouth 2 (two) times daily.    [provider]  finasteride (PROSCAR) 5 MG tablet Take 5 mg by mouth at bedtime.     [provider]  Glycerin-Hypromellose-PEG 400 (SM DRY EYE RELIEF OP) Place 1 drop into both eyes as needed.    [provider]  ketotifen (ZADITOR) 0.025 % ophthalmic solution Place 1 drop into both eyes 2 (two) times daily.  [provider]  Multiple Vitamins-Minerals (ICAPS AREDS 2 PO) Take 1 capsule by mouth 2 (two) times daily.    [provider]  polyethylene glycol (MIRALAX / GLYCOLAX) packet Take 17 g by mouth daily as needed (for constipation). 12/16/16   Camnitz, Ocie Doyne, MD  tamsulosin (FLOMAX) 0.4 MG CAPS capsule Take 1 capsule (0.4 mg total) by mouth daily. 04/18/17   Okey Regal, PA-C    Family History Family History  Problem Relation Age of Onset  . Cancer Mother   . Prostate cancer Father     Social History Social History   Tobacco Use  . Smoking status: Former Smoker    Types: Cigarettes  . Smokeless tobacco: Never Used  Substance Use Topics  . Alcohol use: No  . Drug use: No     Allergies   Atenolol; Lisinopril; Nifedipine; Penicillins; Sulfa  antibiotics; and Zocor [simvastatin]   Review of Systems Review of Systems  All other systems reviewed and are negative.    Physical Exam Updated Vital Signs BP (!) 154/72   Pulse 68   Temp 97.9 F (36.6 C) (Oral)   Ht 5\' 5"  (1.651 m)   Wt 70.3 kg (155 lb)   SpO2 96%   BMI 25.79 kg/m   Physical Exam  Constitutional: He is oriented to person, place, and time. He appears well-developed and well-nourished.  HENT:  Head: Normocephalic and atraumatic.  Eyes: Conjunctivae are normal. Pupils are equal, round, and reactive to light. Right eye exhibits no discharge. Left eye exhibits no discharge. No scleral icterus.  Neck: Normal range of motion. No JVD present. No tracheal deviation present.  Pulmonary/Chest: Effort normal. No stridor.  Abdominal: Soft. He exhibits no distension and no mass. There is no tenderness. There is no rebound and no guarding. No hernia.  Genitourinary:  Genitourinary Comments: Circumcised penis, no rashes, no signs of infection, nontender to palpation-urethral meatus without obstruction  Neurological: He is alert and oriented to person, place, and time. Coordination normal.  Psychiatric: He has a normal mood and affect. His behavior is normal. Judgment and thought content normal.  Nursing note and vitals reviewed.    ED Treatments / Results  Labs (all labs ordered are listed, but only abnormal results are displayed) Labs Reviewed  URINALYSIS, ROUTINE W REFLEX MICROSCOPIC - Abnormal; Notable for the following components:      Result Value   APPearance HAZY (*)    All other components within normal limits  URINE CULTURE    EKG  EKG Interpretation None       Radiology No results found.  Procedures Procedures (including critical care time)  Medications Ordered in ED Medications - No data to display   Initial Impression / Assessment and Plan / ED Course  I have reviewed the triage vital signs and the nursing notes.  Pertinent labs &  imaging results that were available during my care of the patient were reviewed by me and considered in my medical decision making (see chart for details).      Final Clinical Impressions(s) / ED Diagnoses   Final diagnoses:  Urinary hesitancy   Labs:   Imaging:  Consults:  Therapeutics:  Discharge Meds: Flomax  Assessment/Plan: 83 year old male presents today with urinary hesitancy.  Patient well-appearing in no acute distress.  He has a soft benign nontender abdomen.  Patient is able to produce urine here without significant difficulty, no signs of urinary retention.  No signs of infectious etiology here today.  Urine will be  cultured, patient was placed on Flomax, he will follow-up as an outpatient with urology and return immediately with any new or worsening signs or symptoms.  Patient verbalized understanding and agreement to today's plan had no further questions or concerns at time of discharge.    ED Discharge Orders        Ordered    tamsulosin (FLOMAX) 0.4 MG CAPS capsule  Daily     04/18/17 1411       Okey Regal, PA-C 04/18/17 1414    Noemi Chapel, MD 04/19/17 [phone]

## 2017-04-18 NOTE — ED Notes (Signed)
Daughter had arrived, and planned to transport patient back to [facility] at discharge. PTAR cancelled. Discharge instructions reviewed with patient and daughter. Understanding verbalized.

## 2017-04-18 NOTE — ED Notes (Signed)
Pt given sandwich bag per Bonnie(RN)

## 2017-04-19 ENCOUNTER — Encounter: Payer: Medicare Other | Admitting: Cardiology

## 2017-04-19 ENCOUNTER — Ambulatory Visit: Payer: Medicare Other | Admitting: Family Medicine

## 2017-04-19 LAB — URINE CULTURE: Culture: NO GROWTH

## 2017-04-28 ENCOUNTER — Ambulatory Visit: Payer: Medicare Other | Admitting: Family Medicine

## 2017-05-07 ENCOUNTER — Ambulatory Visit: Payer: Medicare Other | Admitting: Family Medicine

## 2017-05-12 ENCOUNTER — Ambulatory Visit (INDEPENDENT_AMBULATORY_CARE_PROVIDER_SITE_OTHER): Payer: Medicare Other | Admitting: Family Medicine

## 2017-05-12 ENCOUNTER — Encounter: Payer: Self-pay | Admitting: Family Medicine

## 2017-05-12 VITALS — BP 136/78 | HR 78 | Temp 97.7°F | Ht 65.0 in | Wt 155.2 lb

## 2017-05-12 DIAGNOSIS — R351 Nocturia: Secondary | ICD-10-CM | POA: Diagnosis not present

## 2017-05-12 DIAGNOSIS — F324 Major depressive disorder, single episode, in partial remission: Secondary | ICD-10-CM | POA: Insufficient documentation

## 2017-05-12 DIAGNOSIS — N183 Chronic kidney disease, stage 3 unspecified: Secondary | ICD-10-CM

## 2017-05-12 DIAGNOSIS — I10 Essential (primary) hypertension: Secondary | ICD-10-CM | POA: Diagnosis not present

## 2017-05-12 DIAGNOSIS — I495 Sick sinus syndrome: Secondary | ICD-10-CM

## 2017-05-12 DIAGNOSIS — M1000 Idiopathic gout, unspecified site: Secondary | ICD-10-CM

## 2017-05-12 DIAGNOSIS — N401 Enlarged prostate with lower urinary tract symptoms: Secondary | ICD-10-CM | POA: Diagnosis not present

## 2017-05-12 DIAGNOSIS — I5030 Unspecified diastolic (congestive) heart failure: Secondary | ICD-10-CM | POA: Diagnosis not present

## 2017-05-12 NOTE — Progress Notes (Signed)
HPI:   [name] is a 84 y.o. male, who is here today to establish care.  [facility]. Last preventive routine visit: 05/2016.  Chronic medical problems: Atrial fib,CHF,HTN,OA,back pain, gout,CKD III, depression, BPH among some.  He moved from [place] a few months ago, his daughter lives in town. He is living in assisting living.  Depression: Exacerbated recently due to health issues. States that he first moved to [place] he had difficulty establishing with a new cardiologist,he had problems with his pacemaker. He is not on pharmacologic treatment. He is feeling better now, denies suicidal thoughts.  He follows with psychiatrists through the [facility].   Hx of sick sinus synd and atrial fib. He follows with Dr Eugene Miller. Pacemaker implanted in 2002, generator changed in 11/2016,complicated by hematoma. No chest pain,palpitations,or syncope.  Hx of HTN, he is on non pharmacologic treatment. Home BP's 140's/80-90's.  Lab Results  Component Value Date   CREATININE 1.44 (H) 03/24/2017   BUN 24 (H) 03/24/2017   NA 136 03/24/2017   K 3.9 03/24/2017   CL 103 03/24/2017   CO2 25 03/24/2017    Hx of constipation, states that he was taking OTC Miralax but since he started eating yogurt bid constipation has resolved.  Denies abdominal pain, nausea, or vomiting.  BPH with nocturia: He is currently on Flomax and Proscar. He follows with urinologist every 6 months. He has not noted gross hematuria,dysuria,or pelvic pain. Nocturia x 2-3, stable.  Gout:  He is on Allopurinol 300 mg daily. Last gout attack in 2001, he states that pain was so severe that he even "thought a bout killing myself." He has not had any exacerbation since he started medication. Hx of OA and unstable gait. He has a walker. No falls in the past year.   He has no concerns today.    Review of Systems  Constitutional: Negative for activity change, appetite  change, fatigue and fever.  HENT: Negative for nosebleeds, sore throat and trouble swallowing.   Eyes: Negative for redness and visual disturbance.  Respiratory: Negative for cough, shortness of breath and wheezing.   Cardiovascular: Negative for chest pain, palpitations and leg swelling.  Gastrointestinal: Negative for abdominal pain, nausea and vomiting.  Endocrine: Negative for cold intolerance and heat intolerance.  Genitourinary: Positive for frequency. Negative for decreased urine volume, dysuria and hematuria.  Musculoskeletal: Positive for arthralgias, back pain and gait problem.  Skin: Negative for rash.  Neurological: Positive for headaches. Negative for dizziness, syncope and weakness.  Psychiatric/Behavioral: Negative for confusion and suicidal ideas. The patient is nervous/anxious.       Current Outpatient Medications on File Prior to Visit  Medication Sig Dispense Refill  . allopurinol (ZYLOPRIM) 300 MG tablet Take 300 mg by mouth daily.    Marland Kitchen aspirin EC 81 MG tablet Take 81 mg by mouth daily.    . B Complex Vitamins (VITAMIN B COMPLEX PO) Take 1 capsule by mouth daily.    . Calcium Carbonate-Vitamin D3 (CALCIUM 600-D) 600-400 MG-UNIT TABS Take 1 tablet by mouth 2 (two) times daily.    . finasteride (PROSCAR) 5 MG tablet Take 5 mg by mouth at bedtime.     . Glycerin-Hypromellose-PEG 400 (SM DRY EYE RELIEF OP) Place 1 drop into both eyes as needed.    Marland Kitchen ketotifen (ZADITOR) 0.025 % ophthalmic solution Place 1 drop into both eyes 2 (two) times daily.    . Multiple Vitamins-Minerals (ICAPS  AREDS 2 PO) Take 1 capsule by mouth 2 (two) times daily.    . polyethylene glycol (MIRALAX / GLYCOLAX) packet Take 17 g by mouth daily as needed (for constipation). 14 each 0  . tamsulosin (FLOMAX) 0.4 MG CAPS capsule Take 1 capsule (0.4 mg total) by mouth daily. 30 capsule 0   No current facility-administered medications on file prior to visit.      Past Medical History:  Diagnosis Date    . A-fib ([place])   . Aortic regurgitation   . Arthritis   . Basal cell carcinoma (BCC) of nostril   . Cardiac disease   . Deaf   . Depression   . Gout   . Hypertension   . Lumbago   . Migraine headache   . Mitral regurgitation   . Sinusitis   . Spinal stenosis   . SSS (sick sinus syndrome) ([place])   . Tricuspid regurgitation   . Vertigo    Allergies  Allergen Reactions  . Atenolol Swelling  . Lisinopril     Doesn't remember  . Nifedipine     Doesn't remember  . Penicillins     Doesn't remember  . Sulfa Antibiotics     Doesn't remember   . Zocor [Simvastatin]     Doesn't remember     Family History  Problem Relation Age of Onset  . Cancer Mother   . Prostate cancer Father   . Cancer Father     Social History   Socioeconomic History  . Marital status: Widowed    Spouse name: None  . Number of children: None  . Years of education: None  . Highest education level: None  Social Needs  . Financial resource strain: None  . Food insecurity - worry: None  . Food insecurity - inability: None  . Transportation needs - medical: None  . Transportation needs - non-medical: None  Occupational History  . None  Tobacco Use  . Smoking status: Former Smoker    Types: Cigarettes  . Smokeless tobacco: Never Used  Substance and Sexual Activity  . Alcohol use: Yes  . Drug use: No  . Sexual activity: No  Other Topics Concern  . None  Social History Narrative  . None    Vitals:   05/12/17 1428  BP: 136/78  Pulse: 78  Temp: 97.7 F (36.5 C)  SpO2: 95%    Body mass index is 25.83 kg/m.   Physical Exam  Nursing note and vitals reviewed. Constitutional: He is oriented to person, place, and time. He appears well-developed and well-nourished. No distress.  HENT:  Head: Normocephalic and atraumatic.  Mouth/Throat: Oropharynx is clear and moist and mucous membranes are normal. He has dentures.  Eyes: Conjunctivae are normal.  Cardiovascular: Normal rate. An  irregular rhythm present.  Murmur (Soft SEM RUSB) heard. Pulses:      Dorsalis pedis pulses are 2+ on the right side, and 2+ on the left side.  Respiratory: Effort normal and breath sounds normal. No respiratory distress.  GI: Soft. He exhibits no mass. There is no hepatomegaly. There is no tenderness.  Musculoskeletal: He exhibits edema (1+ LE pitting edema,bilateral).       Thoracic back: He exhibits deformity.  Severe kyphosis.  Lymphadenopathy:    He has no cervical adenopathy.  Neurological: He is alert and oriented to person, place, and time. He has normal strength.  Unstable gait assisted with a walker.  Skin: Skin is warm. No erythema.  Psychiatric: His mood appears anxious.  Well groomed, good eye contact.    ASSESSMENT AND PLAN:  Eugene Miller was seen today for establish care.  Diagnoses and all orders for this visit:  CKD (chronic kidney disease), stage III (HCC)  Cr 1.4-1.7. E GFR 34-40. Continue low salt diet and adequate HTN controlled. Avoid NSAID's also recommended. He is having scheduled labs at the [facility] in 05/2017.  Essential hypertension  Adequately controlled for his age. Continue non pharmacologic treatment. Monitor BP periodically.  Diastolic congestive heart failure, unspecified HF chronicity (HCC)  Asymptomatic. Continue following with cardiologist. Has not tolerated Lisinopril or Atenolol in the past.  Acute idiopathic gout, unspecified site  Single episode in 2001,per pt report. He is not interested in decreasing dose of Allopurinol.  BPH associated with nocturia  Stable. No changes in current management. + Nocturia,fall precautions discussed. [facility]  Depression, major, single episode, in partial remission ([place])  Improved. He is following with psychiatrist at the [facility]. Instructed about warning signs.  Sick sinus syndrome (HCC)  S/P Pacemaker placement. Stable. Continue following with Dr Eugene Miller   According  to pt,he follows with the [facility]  Periodically,at least 2-3 times per year. Blood work 2 times per year. I think it is appropriate to see him annually and prn,he agrees. We discussed some side effects of some of his medications,injury/fall prevention. I asked him to sign release form ,so [facility] can [facility] of [facility] and [facility] work.      Betty G. Martinique, MD  [facility]. [place] office.

## 2017-05-12 NOTE — Patient Instructions (Signed)
A few things to remember from today's visit:   CKD (chronic kidney disease), stage III ([place])  Essential hypertension  Diastolic congestive heart failure, unspecified HF chronicity ([place])  Please bring results from labs done at the [facility]. I will see you annually and as needed.   Please be sure medication list is accurate. If a new problem present, please set up appointment sooner than planned today.

## 2017-05-13 ENCOUNTER — Encounter: Payer: Self-pay | Admitting: Family Medicine

## 2017-06-02 ENCOUNTER — Ambulatory Visit: Payer: Medicare Other | Admitting: Podiatry

## 2017-06-16 ENCOUNTER — Encounter: Payer: Self-pay | Admitting: Podiatry

## 2017-06-16 ENCOUNTER — Ambulatory Visit (INDEPENDENT_AMBULATORY_CARE_PROVIDER_SITE_OTHER): Payer: Non-veteran care | Admitting: Podiatry

## 2017-06-16 VITALS — BP 148/73 | HR 82 | Ht 61.0 in | Wt 155.0 lb

## 2017-06-16 DIAGNOSIS — M79672 Pain in left foot: Secondary | ICD-10-CM

## 2017-06-16 DIAGNOSIS — M79671 Pain in right foot: Secondary | ICD-10-CM | POA: Diagnosis not present

## 2017-06-16 DIAGNOSIS — B351 Tinea unguium: Secondary | ICD-10-CM | POA: Diagnosis not present

## 2017-06-16 NOTE — Patient Instructions (Signed)
Seen for hypertrophic nails. All nails debrided. No acute problems noted. Return in 10 weeks or sooner if needed.

## 2017-06-16 NOTE — Progress Notes (Signed)
SUBJECTIVE: 84 y.o. year old male presents complaining of problematic toe nails.  Stated that he has been under podiatry care q 60 days for the nails.  Review of Systems  Constitutional: Negative.   HENT: Negative.   Eyes: Negative.   Respiratory: Negative.   Cardiovascular:       Has [facility].  Gastrointestinal: Negative.   Genitourinary: Negative.   Musculoskeletal: Negative.   Skin: Negative.      OBJECTIVE: DERMATOLOGIC EXAMINATION: Thick yellow dystrophic nails x 10. Painful bunion under the first MPJ left foot. Old scar over the left first metatarsal from old bunion surgery.  VASCULAR EXAMINATION OF LOWER LIMBS: All pedal pulses are palpable with normal pulsation.  Positive for mild forefoot edema bilateral. Temperature gradient from tibial crest to dorsum of foot is within normal bilateral.  NEUROLOGIC EXAMINATION OF THE LOWER LIMBS: All epicritic and tactile sensations grossly intact. Sharp and Dull discriminatory sensations at the plantar ball of hallux is intact bilateral.   MUSCULOSKELETAL EXAMINATION: Positive for right foot bunion with contracted lesser digits. High arched cavus type foot with plantar calluses. S/P Bunion surgery left.  ASSESSMENT: Onychomycosis x 10. Painful plantar callus under 1st MPJ left. Digital deformities bilateral.  PLAN: Reviewed clinical findings. All nails and calluses debrided. May return in 10 weeks.

## 2017-06-21 ENCOUNTER — Ambulatory Visit (INDEPENDENT_AMBULATORY_CARE_PROVIDER_SITE_OTHER): Payer: Medicare Other | Admitting: *Deleted

## 2017-06-21 DIAGNOSIS — I495 Sick sinus syndrome: Secondary | ICD-10-CM | POA: Diagnosis not present

## 2017-06-21 NOTE — Progress Notes (Signed)
Remote pacemaker transmission.   

## 2017-06-24 ENCOUNTER — Encounter: Payer: Self-pay | Admitting: Cardiology

## 2017-07-06 LAB — CUP PACEART REMOTE DEVICE CHECK
Brady Statistic AP VP Percent: 40.79 %
Brady Statistic AP VS Percent: 0.01 %
Brady Statistic AS VP Percent: 58.24 %
Brady Statistic RV Percent Paced: 99.03 %
Date Time Interrogation Session: 20190225044427
Implantable Lead Implant Date: 20020426
Implantable Lead Location: 753859
Implantable Lead Location: 753860
Implantable Lead Model: 5076
Lead Channel Impedance Value: 190 Ohm
Lead Channel Impedance Value: 190 Ohm
Lead Channel Impedance Value: 380 Ohm
Lead Channel Pacing Threshold Amplitude: 1.125 V
Lead Channel Pacing Threshold Pulse Width: 0.4 ms
Lead Channel Sensing Intrinsic Amplitude: 5 mV
Lead Channel Sensing Intrinsic Amplitude: 5 mV
Lead Channel Setting Pacing Amplitude: 2.5 V
Lead Channel Setting Pacing Pulse Width: 0.4 ms
Lead Channel Setting Sensing Sensitivity: 4 mV
MDC IDC LEAD IMPLANT DT: 19941122
MDC IDC MSMT BATTERY REMAINING LONGEVITY: 101 mo
MDC IDC MSMT BATTERY VOLTAGE: 3.07 V
MDC IDC MSMT LEADCHNL RA IMPEDANCE VALUE: 285 Ohm
MDC IDC MSMT LEADCHNL RA PACING THRESHOLD AMPLITUDE: 0.625 V
MDC IDC MSMT LEADCHNL RA SENSING INTR AMPL: 2.625 mV
MDC IDC MSMT LEADCHNL RA SENSING INTR AMPL: 2.625 mV
MDC IDC MSMT LEADCHNL RV PACING THRESHOLD PULSEWIDTH: 0.4 ms
MDC IDC PG IMPLANT DT: 20180806
MDC IDC SET LEADCHNL RA PACING AMPLITUDE: 2 V
MDC IDC STAT BRADY AS VS PERCENT: 0.96 %
MDC IDC STAT BRADY RA PERCENT PACED: 40.64 %

## 2017-07-13 ENCOUNTER — Telehealth: Payer: Self-pay | Admitting: Cardiology

## 2017-07-13 NOTE — Telephone Encounter (Signed)
Patient calling, states that he is moving

## 2017-08-03 DIAGNOSIS — I70213 Atherosclerosis of native arteries of extremities with intermittent claudication, bilateral legs: Secondary | ICD-10-CM | POA: Diagnosis not present

## 2017-08-03 DIAGNOSIS — M79671 Pain in right foot: Secondary | ICD-10-CM | POA: Diagnosis not present

## 2017-08-03 DIAGNOSIS — M2041 Other hammer toe(s) (acquired), right foot: Secondary | ICD-10-CM | POA: Diagnosis not present

## 2017-08-03 DIAGNOSIS — M2042 Other hammer toe(s) (acquired), left foot: Secondary | ICD-10-CM | POA: Diagnosis not present

## 2017-08-03 DIAGNOSIS — Q819 Epidermolysis bullosa, unspecified: Secondary | ICD-10-CM | POA: Diagnosis not present

## 2017-08-03 DIAGNOSIS — I739 Peripheral vascular disease, unspecified: Secondary | ICD-10-CM | POA: Diagnosis not present

## 2017-08-03 DIAGNOSIS — B351 Tinea unguium: Secondary | ICD-10-CM | POA: Diagnosis not present

## 2017-08-10 DIAGNOSIS — N401 Enlarged prostate with lower urinary tract symptoms: Secondary | ICD-10-CM | POA: Diagnosis not present

## 2017-08-17 DIAGNOSIS — I495 Sick sinus syndrome: Secondary | ICD-10-CM | POA: Diagnosis not present

## 2017-08-17 DIAGNOSIS — Z950 Presence of cardiac pacemaker: Secondary | ICD-10-CM | POA: Diagnosis not present

## 2017-08-17 DIAGNOSIS — I480 Paroxysmal atrial fibrillation: Secondary | ICD-10-CM | POA: Diagnosis not present

## 2017-08-17 DIAGNOSIS — I10 Essential (primary) hypertension: Secondary | ICD-10-CM | POA: Diagnosis not present

## 2017-08-18 DIAGNOSIS — M2041 Other hammer toe(s) (acquired), right foot: Secondary | ICD-10-CM | POA: Diagnosis not present

## 2017-08-18 DIAGNOSIS — L928 Other granulomatous disorders of the skin and subcutaneous tissue: Secondary | ICD-10-CM | POA: Diagnosis not present

## 2017-08-19 DIAGNOSIS — M65871 Other synovitis and tenosynovitis, right ankle and foot: Secondary | ICD-10-CM | POA: Diagnosis not present

## 2017-08-25 ENCOUNTER — Ambulatory Visit: Payer: Medicare Other | Admitting: Podiatry

## 2017-08-31 DIAGNOSIS — I495 Sick sinus syndrome: Secondary | ICD-10-CM | POA: Diagnosis not present

## 2017-09-21 ENCOUNTER — Encounter: Payer: Medicare Other | Admitting: *Deleted

## 2017-09-24 ENCOUNTER — Encounter: Payer: Self-pay | Admitting: Cardiology

## 2017-09-27 ENCOUNTER — Ambulatory Visit (INDEPENDENT_AMBULATORY_CARE_PROVIDER_SITE_OTHER): Payer: Medicare Other | Admitting: *Deleted

## 2017-09-27 DIAGNOSIS — I480 Paroxysmal atrial fibrillation: Secondary | ICD-10-CM

## 2017-09-27 DIAGNOSIS — I495 Sick sinus syndrome: Secondary | ICD-10-CM | POA: Diagnosis not present

## 2017-09-27 NOTE — Progress Notes (Signed)
Remote pacemaker transmission.   

## 2017-09-28 ENCOUNTER — Telehealth: Payer: Self-pay | Admitting: Cardiology

## 2017-09-28 NOTE — Telephone Encounter (Signed)
Spoke with pt and pts son informed them that they needed to get the patient established with an electrophysiologist doctor there so that if there are issues with his pacemaker that they can be addressed there in [place], informed them that there could be issues potentially with the pacemaker that would be detected on the remote transmission but our clinic and MDs would not be able to do anything about since pt is in [place], pt and son voiced understanding.

## 2017-09-28 NOTE — Telephone Encounter (Signed)
New Message   Pt and his son verbalized that the pt is living in [place] and will not be coming to any ov appts. He will just continue doing his home remote checks

## 2017-10-07 LAB — CUP PACEART REMOTE DEVICE CHECK
Brady Statistic AP VP Percent: 23.31 %
Brady Statistic AS VP Percent: 75.78 %
Brady Statistic RA Percent Paced: 23.46 %
Brady Statistic RV Percent Paced: 99.09 %
Date Time Interrogation Session: 20190603134350
Implantable Lead Implant Date: 19941122
Implantable Lead Implant Date: 20020426
Implantable Lead Location: 753860
Implantable Lead Model: 5076
Implantable Pulse Generator Implant Date: 20180806
Lead Channel Impedance Value: 209 Ohm
Lead Channel Impedance Value: 380 Ohm
Lead Channel Pacing Threshold Pulse Width: 0.4 ms
Lead Channel Sensing Intrinsic Amplitude: 6.625 mV
Lead Channel Setting Pacing Amplitude: 2 V
Lead Channel Setting Pacing Pulse Width: 0.4 ms
MDC IDC LEAD LOCATION: 753859
MDC IDC MSMT BATTERY REMAINING LONGEVITY: 98 mo
MDC IDC MSMT BATTERY VOLTAGE: 3.03 V
MDC IDC MSMT LEADCHNL RA IMPEDANCE VALUE: 304 Ohm
MDC IDC MSMT LEADCHNL RA PACING THRESHOLD AMPLITUDE: 0.625 V
MDC IDC MSMT LEADCHNL RA SENSING INTR AMPL: 2.75 mV
MDC IDC MSMT LEADCHNL RA SENSING INTR AMPL: 2.75 mV
MDC IDC MSMT LEADCHNL RV IMPEDANCE VALUE: 190 Ohm
MDC IDC MSMT LEADCHNL RV PACING THRESHOLD AMPLITUDE: 1.125 V
MDC IDC MSMT LEADCHNL RV PACING THRESHOLD PULSEWIDTH: 0.4 ms
MDC IDC MSMT LEADCHNL RV SENSING INTR AMPL: 6.625 mV
MDC IDC SET LEADCHNL RV PACING AMPLITUDE: 2.5 V
MDC IDC SET LEADCHNL RV SENSING SENSITIVITY: 4 mV
MDC IDC STAT BRADY AP VS PERCENT: 0 %
MDC IDC STAT BRADY AS VS PERCENT: 0.91 %

## 2017-10-13 DIAGNOSIS — Q819 Epidermolysis bullosa, unspecified: Secondary | ICD-10-CM | POA: Diagnosis not present

## 2017-10-13 DIAGNOSIS — M79672 Pain in left foot: Secondary | ICD-10-CM | POA: Diagnosis not present

## 2017-10-13 DIAGNOSIS — M79671 Pain in right foot: Secondary | ICD-10-CM | POA: Diagnosis not present

## 2017-10-13 DIAGNOSIS — I70213 Atherosclerosis of native arteries of extremities with intermittent claudication, bilateral legs: Secondary | ICD-10-CM | POA: Diagnosis not present

## 2017-10-13 DIAGNOSIS — B351 Tinea unguium: Secondary | ICD-10-CM | POA: Diagnosis not present

## 2017-10-20 DIAGNOSIS — M2042 Other hammer toe(s) (acquired), left foot: Secondary | ICD-10-CM | POA: Diagnosis not present

## 2017-10-20 DIAGNOSIS — L928 Other granulomatous disorders of the skin and subcutaneous tissue: Secondary | ICD-10-CM | POA: Diagnosis not present

## 2017-12-09 DIAGNOSIS — I495 Sick sinus syndrome: Secondary | ICD-10-CM | POA: Diagnosis not present

## 2017-12-10 ENCOUNTER — Telehealth: Payer: Self-pay

## 2017-12-10 NOTE — Telephone Encounter (Signed)
I called pt to see if he wanted to be released from [facility] to [facility].

## 2017-12-15 ENCOUNTER — Telehealth: Payer: Self-pay | Admitting: Cardiology

## 2017-12-15 NOTE — Telephone Encounter (Signed)
Attempted to call pt due to Request to release his [facility] information to [employer]. The call could not be completed as dialed.

## 2017-12-16 DIAGNOSIS — M79674 Pain in right toe(s): Secondary | ICD-10-CM | POA: Diagnosis not present

## 2017-12-16 DIAGNOSIS — I70213 Atherosclerosis of native arteries of extremities with intermittent claudication, bilateral legs: Secondary | ICD-10-CM | POA: Diagnosis not present

## 2017-12-16 DIAGNOSIS — Q819 Epidermolysis bullosa, unspecified: Secondary | ICD-10-CM | POA: Diagnosis not present

## 2017-12-16 DIAGNOSIS — M79675 Pain in left toe(s): Secondary | ICD-10-CM | POA: Diagnosis not present

## 2017-12-16 DIAGNOSIS — B351 Tinea unguium: Secondary | ICD-10-CM | POA: Diagnosis not present

## 2017-12-22 DIAGNOSIS — I10 Essential (primary) hypertension: Secondary | ICD-10-CM | POA: Diagnosis not present

## 2017-12-22 DIAGNOSIS — Z950 Presence of cardiac pacemaker: Secondary | ICD-10-CM | POA: Diagnosis not present

## 2017-12-22 DIAGNOSIS — I480 Paroxysmal atrial fibrillation: Secondary | ICD-10-CM | POA: Diagnosis not present

## 2017-12-22 DIAGNOSIS — I495 Sick sinus syndrome: Secondary | ICD-10-CM | POA: Diagnosis not present

## 2017-12-28 ENCOUNTER — Encounter: Payer: Medicare Other | Admitting: *Deleted

## 2017-12-28 DIAGNOSIS — M9903 Segmental and somatic dysfunction of lumbar region: Secondary | ICD-10-CM | POA: Diagnosis not present

## 2017-12-28 DIAGNOSIS — M545 Low back pain: Secondary | ICD-10-CM | POA: Diagnosis not present

## 2017-12-28 DIAGNOSIS — M6249 Contracture of muscle, multiple sites: Secondary | ICD-10-CM | POA: Diagnosis not present

## 2017-12-29 DIAGNOSIS — M9903 Segmental and somatic dysfunction of lumbar region: Secondary | ICD-10-CM | POA: Diagnosis not present

## 2017-12-29 DIAGNOSIS — M545 Low back pain: Secondary | ICD-10-CM | POA: Diagnosis not present

## 2017-12-29 DIAGNOSIS — M6249 Contracture of muscle, multiple sites: Secondary | ICD-10-CM | POA: Diagnosis not present

## 2017-12-31 DIAGNOSIS — M9903 Segmental and somatic dysfunction of lumbar region: Secondary | ICD-10-CM | POA: Diagnosis not present

## 2017-12-31 DIAGNOSIS — M545 Low back pain: Secondary | ICD-10-CM | POA: Diagnosis not present

## 2017-12-31 DIAGNOSIS — M6249 Contracture of muscle, multiple sites: Secondary | ICD-10-CM | POA: Diagnosis not present

## 2018-01-03 DIAGNOSIS — M6249 Contracture of muscle, multiple sites: Secondary | ICD-10-CM | POA: Diagnosis not present

## 2018-01-03 DIAGNOSIS — M545 Low back pain: Secondary | ICD-10-CM | POA: Diagnosis not present

## 2018-01-03 DIAGNOSIS — M9903 Segmental and somatic dysfunction of lumbar region: Secondary | ICD-10-CM | POA: Diagnosis not present

## 2018-02-11 DIAGNOSIS — N401 Enlarged prostate with lower urinary tract symptoms: Secondary | ICD-10-CM | POA: Diagnosis not present

## 2018-02-11 DIAGNOSIS — R109 Unspecified abdominal pain: Secondary | ICD-10-CM | POA: Diagnosis not present

## 2018-02-17 DIAGNOSIS — I70213 Atherosclerosis of native arteries of extremities with intermittent claudication, bilateral legs: Secondary | ICD-10-CM | POA: Diagnosis not present

## 2018-02-17 DIAGNOSIS — M79674 Pain in right toe(s): Secondary | ICD-10-CM | POA: Diagnosis not present

## 2018-02-17 DIAGNOSIS — M79675 Pain in left toe(s): Secondary | ICD-10-CM | POA: Diagnosis not present

## 2018-02-17 DIAGNOSIS — Q819 Epidermolysis bullosa, unspecified: Secondary | ICD-10-CM | POA: Diagnosis not present

## 2018-02-17 DIAGNOSIS — B351 Tinea unguium: Secondary | ICD-10-CM | POA: Diagnosis not present

## 2018-02-18 DIAGNOSIS — N281 Cyst of kidney, acquired: Secondary | ICD-10-CM | POA: Diagnosis not present

## 2018-02-18 DIAGNOSIS — K5730 Diverticulosis of large intestine without perforation or abscess without bleeding: Secondary | ICD-10-CM | POA: Diagnosis not present

## 2018-02-19 DIAGNOSIS — Z950 Presence of cardiac pacemaker: Secondary | ICD-10-CM | POA: Diagnosis not present

## 2018-02-25 DIAGNOSIS — N401 Enlarged prostate with lower urinary tract symptoms: Secondary | ICD-10-CM | POA: Diagnosis not present

## 2018-02-25 DIAGNOSIS — R109 Unspecified abdominal pain: Secondary | ICD-10-CM | POA: Diagnosis not present

## 2018-04-04 DIAGNOSIS — I495 Sick sinus syndrome: Secondary | ICD-10-CM | POA: Diagnosis not present

## 2018-04-26 DIAGNOSIS — M79674 Pain in right toe(s): Secondary | ICD-10-CM | POA: Diagnosis not present

## 2018-04-26 DIAGNOSIS — M79675 Pain in left toe(s): Secondary | ICD-10-CM | POA: Diagnosis not present

## 2018-04-26 DIAGNOSIS — Q819 Epidermolysis bullosa, unspecified: Secondary | ICD-10-CM | POA: Diagnosis not present

## 2018-04-26 DIAGNOSIS — I70213 Atherosclerosis of native arteries of extremities with intermittent claudication, bilateral legs: Secondary | ICD-10-CM | POA: Diagnosis not present

## 2018-04-26 DIAGNOSIS — B351 Tinea unguium: Secondary | ICD-10-CM | POA: Diagnosis not present

## 2018-04-29 IMAGING — CR DG CHEST 2V
2 series · 2 of 2 positions shown · non-contrast
Comparison: None available.

CLINICAL DATA: Initial evaluation for acute shortness of breath,
cough.

EXAM:
CHEST  2 VIEW

[chest lat]
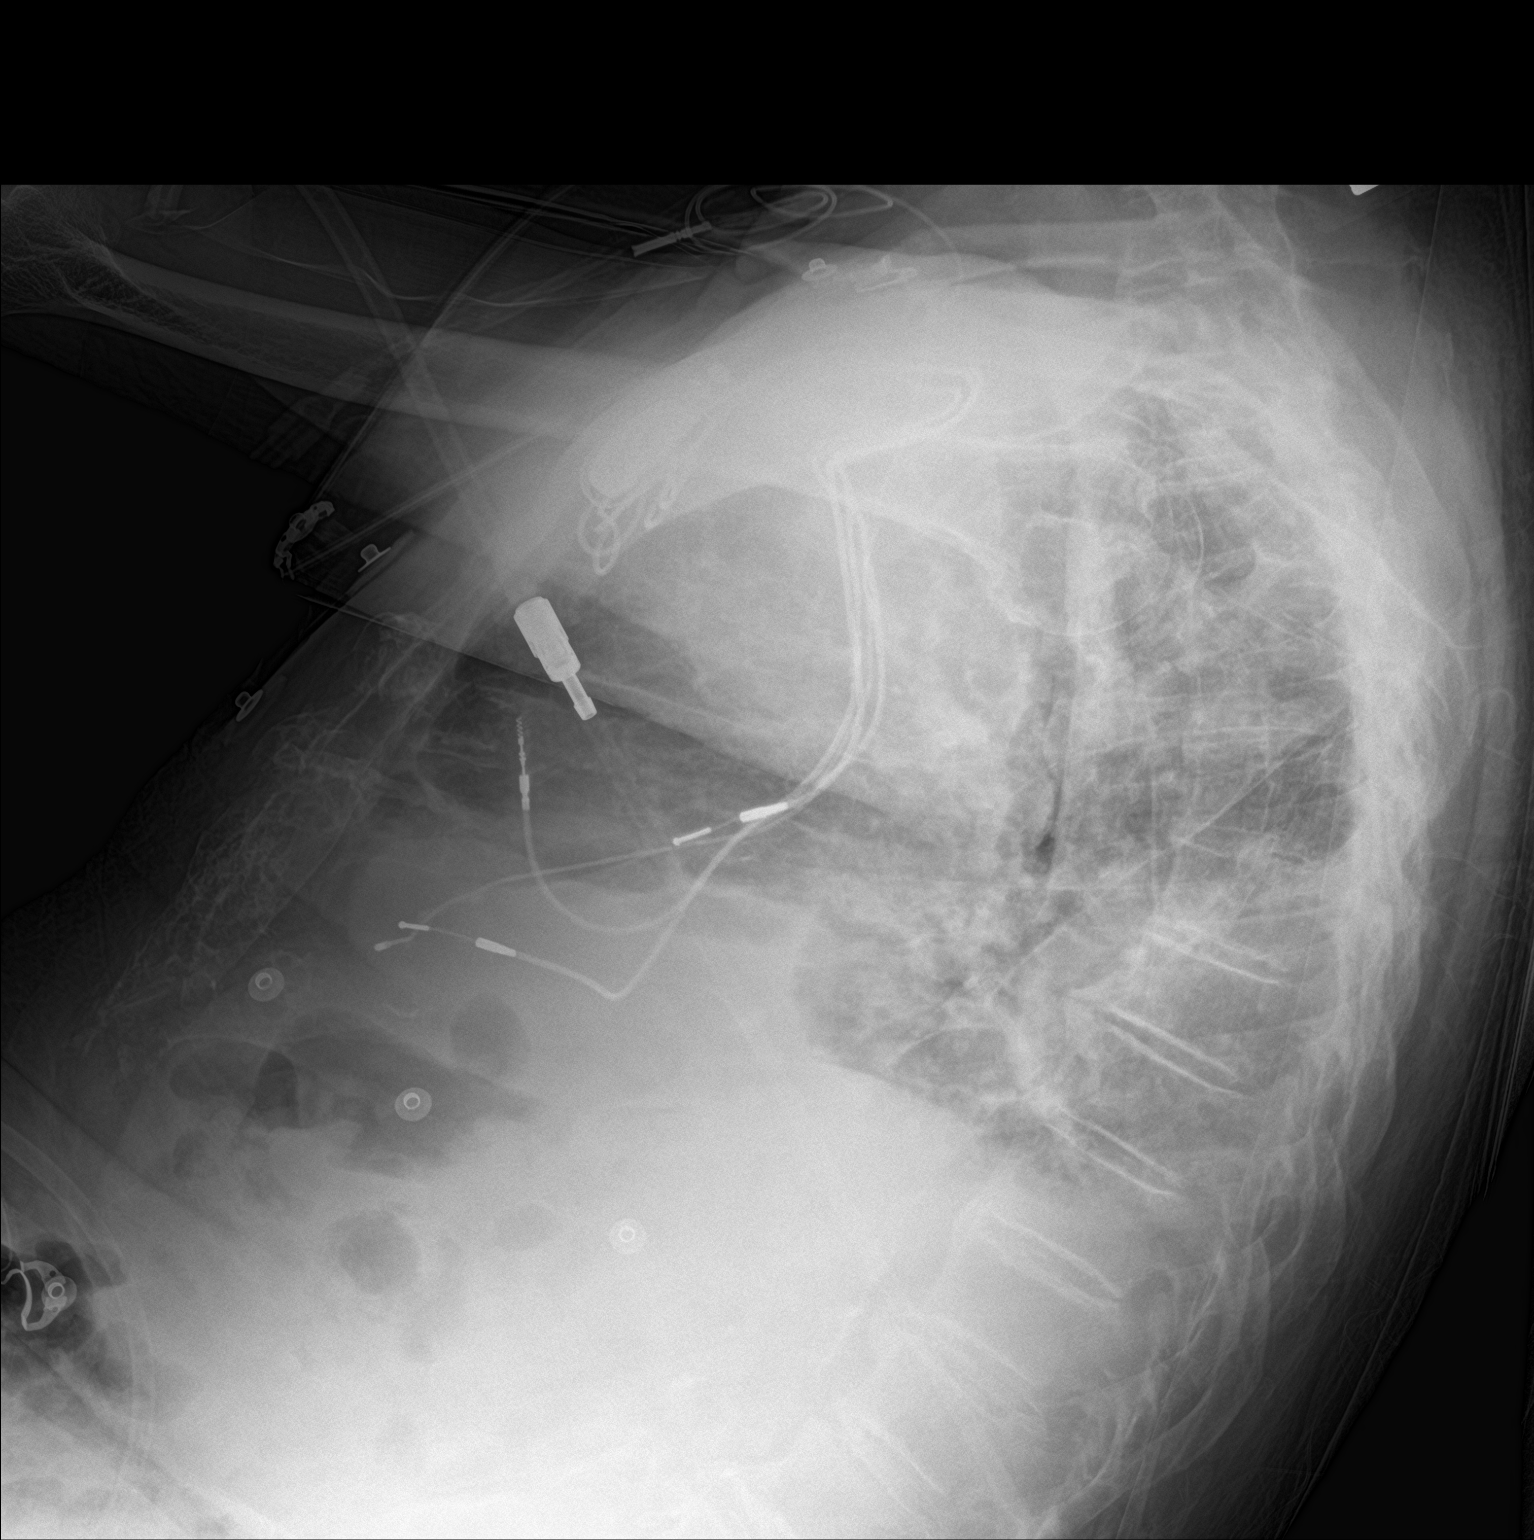

[chest ap]
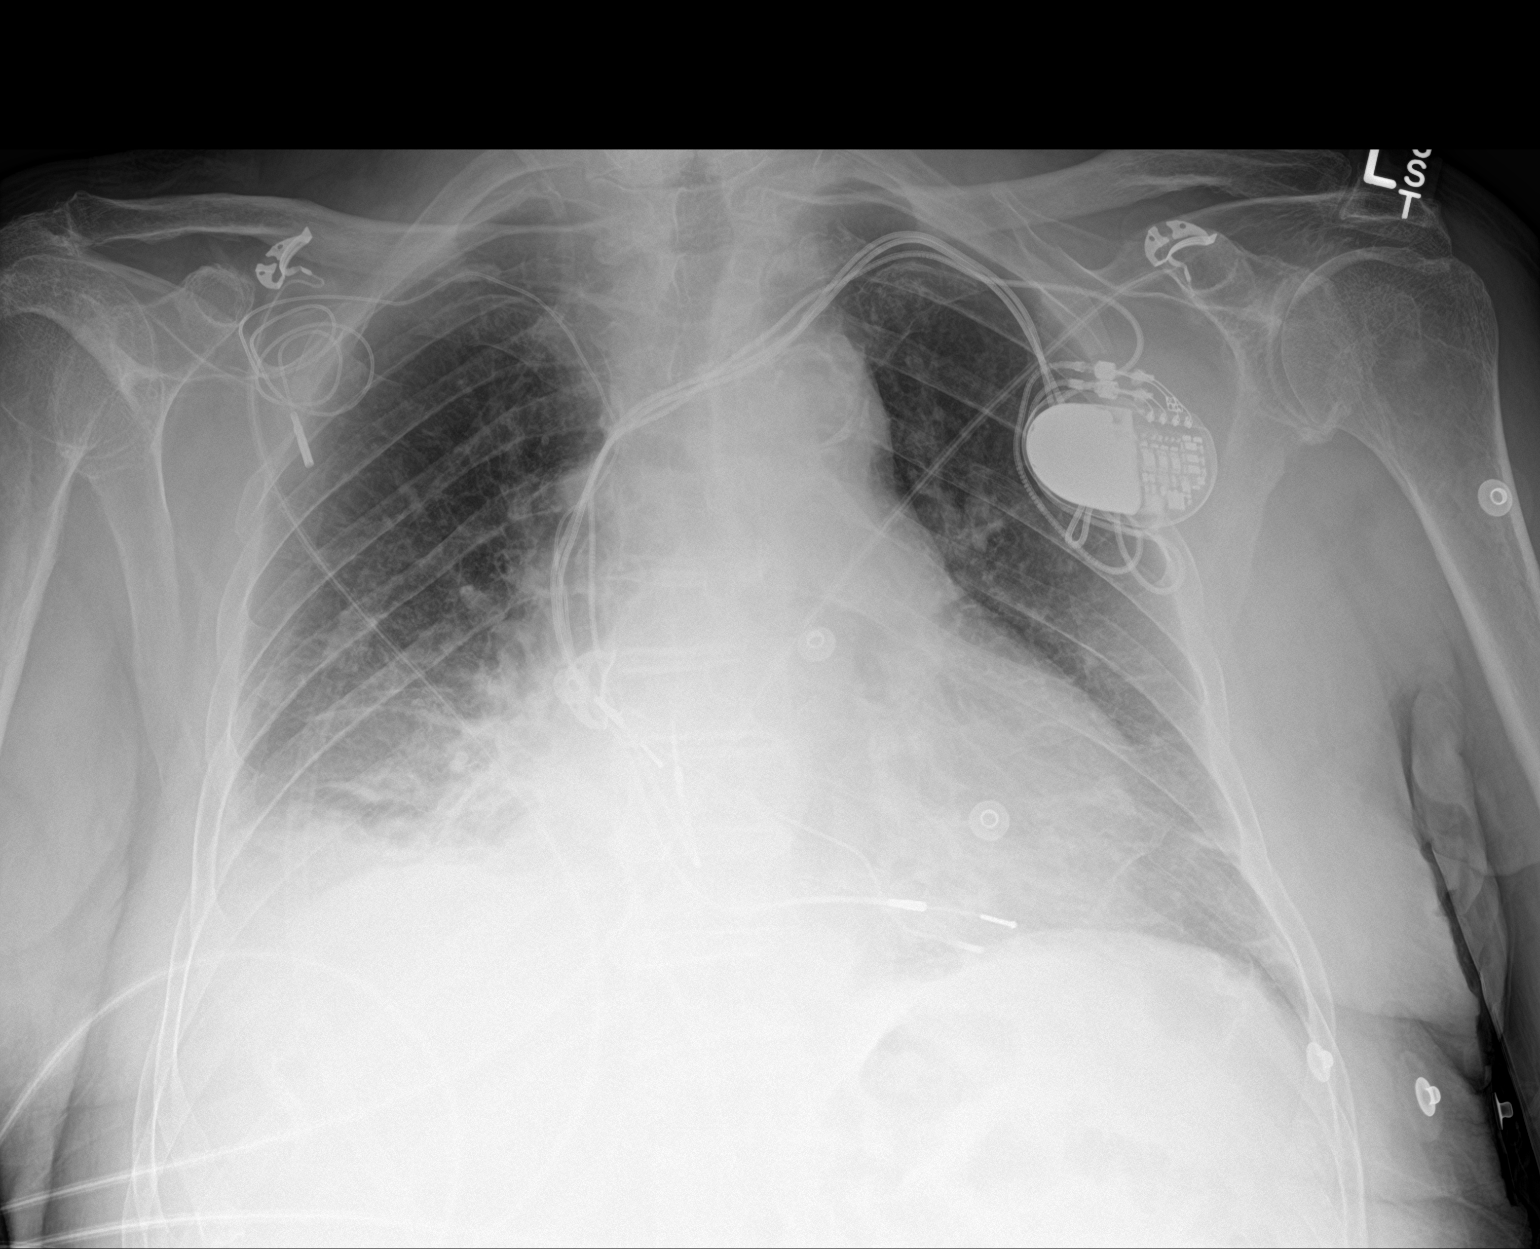

[2 of 2 positions shown; findings below may reference images not displayed]

FINDINGS: Left-sided pacemaker/AICD in place. Moderate cardiomegaly.
Mediastinal silhouette normal. Aortic atherosclerosis.

Lungs hypoinflated. Small right pleural effusion. Patchy right
basilar opacity, which may reflect atelectasis or infiltrate.
Probable scattered atelectatic changes at the left lung base.
Perihilar vascular congestion without pulmonary edema. No
pneumothorax.

No acute osseus abnormality.
IMPRESSION: 1. Small right pleural effusion with associated right basilar
opacity, which may reflect atelectasis or infiltrate.
2. Additional streaky left basilar atelectatic changes.
3. Cardiomegaly with mild perihilar vascular congestion without
overt pulmonary edema.
4. Aortic atherosclerosis.

## 2018-05-20 DIAGNOSIS — Z950 Presence of cardiac pacemaker: Secondary | ICD-10-CM | POA: Diagnosis not present

## 2018-06-22 DIAGNOSIS — I480 Paroxysmal atrial fibrillation: Secondary | ICD-10-CM | POA: Diagnosis not present

## 2018-06-22 DIAGNOSIS — I10 Essential (primary) hypertension: Secondary | ICD-10-CM | POA: Diagnosis not present

## 2018-06-22 DIAGNOSIS — Z950 Presence of cardiac pacemaker: Secondary | ICD-10-CM | POA: Diagnosis not present

## 2018-06-22 DIAGNOSIS — I495 Sick sinus syndrome: Secondary | ICD-10-CM | POA: Diagnosis not present

## 2018-06-29 DIAGNOSIS — M7741 Metatarsalgia, right foot: Secondary | ICD-10-CM | POA: Diagnosis not present

## 2018-06-29 DIAGNOSIS — B351 Tinea unguium: Secondary | ICD-10-CM | POA: Diagnosis not present

## 2018-06-29 DIAGNOSIS — M2011 Hallux valgus (acquired), right foot: Secondary | ICD-10-CM | POA: Diagnosis not present

## 2018-06-29 DIAGNOSIS — L84 Corns and callosities: Secondary | ICD-10-CM | POA: Diagnosis not present

## 2018-06-29 DIAGNOSIS — I70213 Atherosclerosis of native arteries of extremities with intermittent claudication, bilateral legs: Secondary | ICD-10-CM | POA: Diagnosis not present

## 2018-06-29 DIAGNOSIS — L600 Ingrowing nail: Secondary | ICD-10-CM | POA: Diagnosis not present

## 2018-08-21 IMAGING — CR DG CHEST 2V
2 series · 2 of 2 positions shown · non-contrast
Comparison: 11/30/2016

CLINICAL DATA: Orthopnea.

EXAM:
CHEST  2 VIEW

[chest lat]
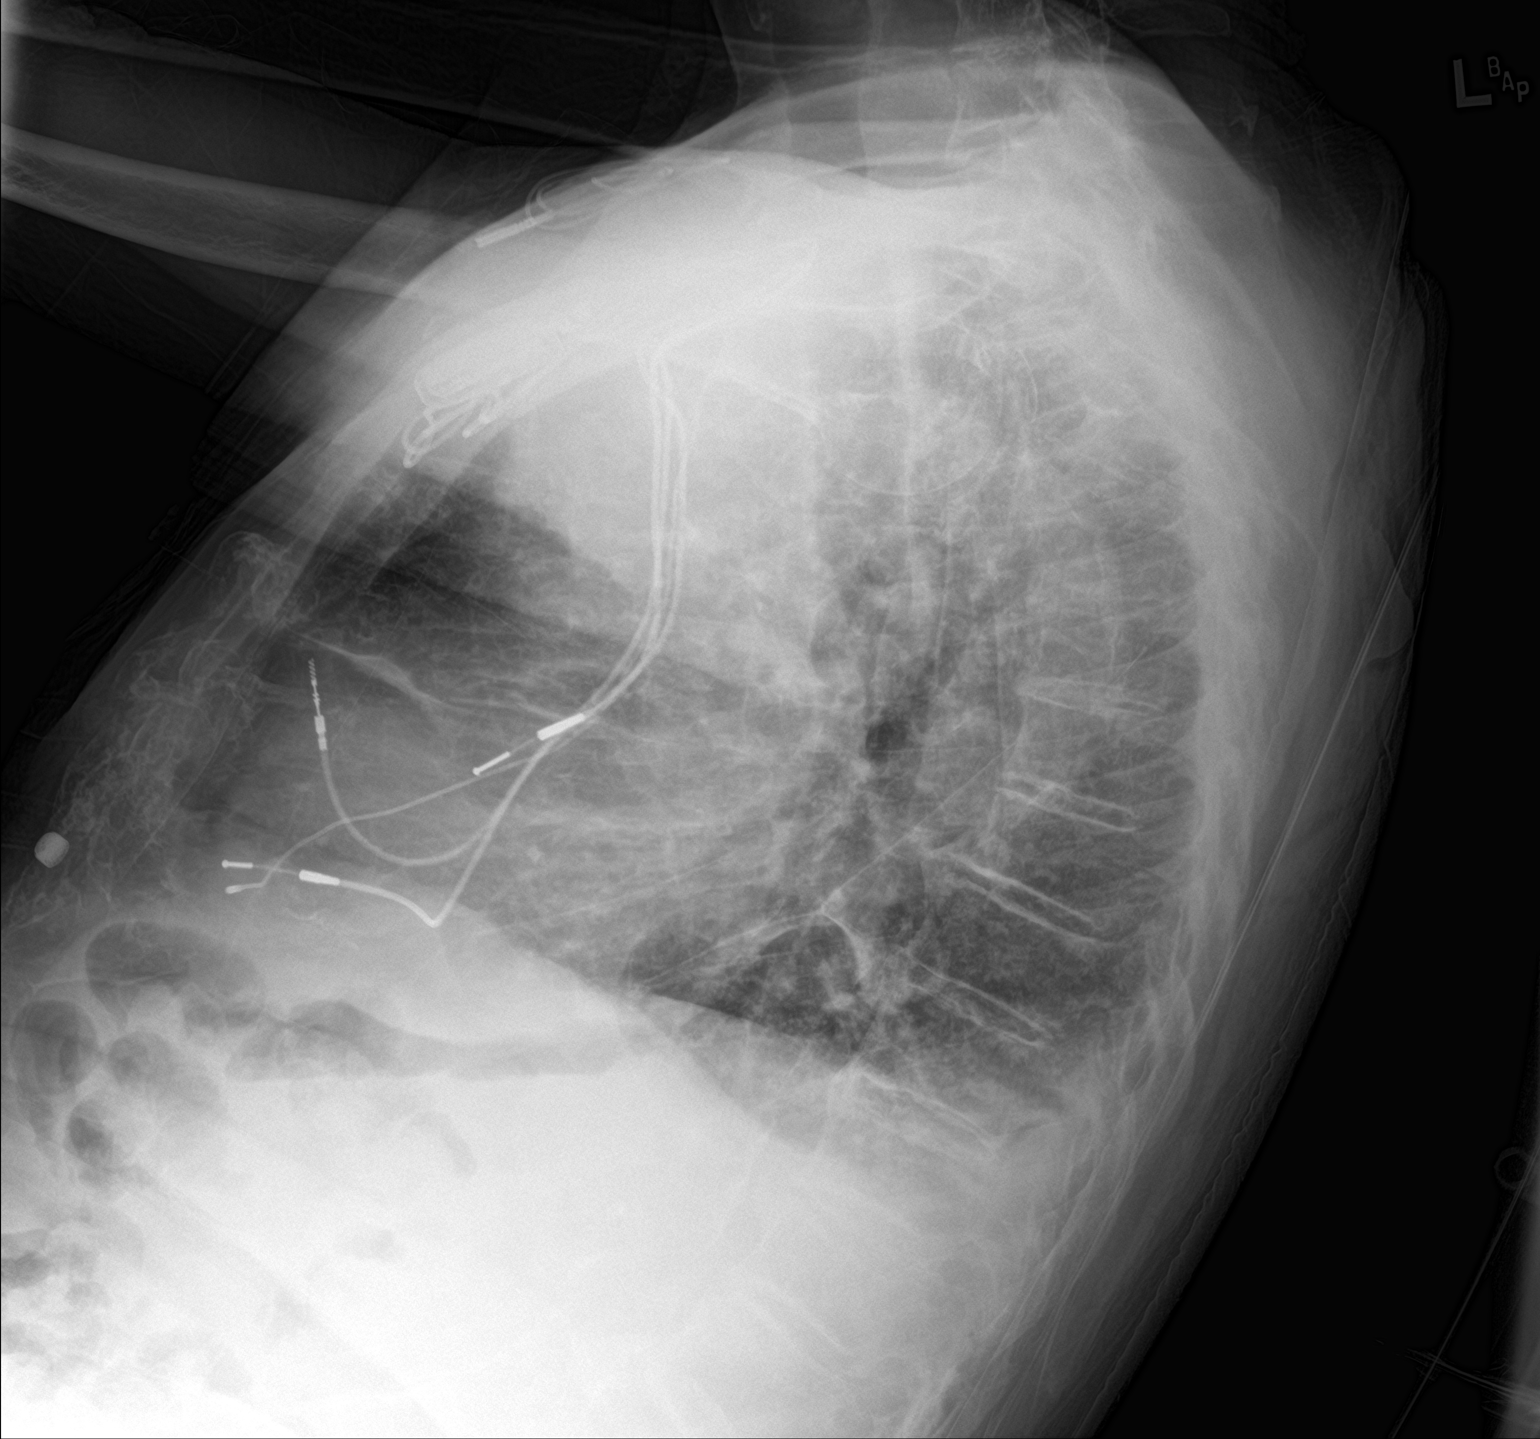

[chest ap]
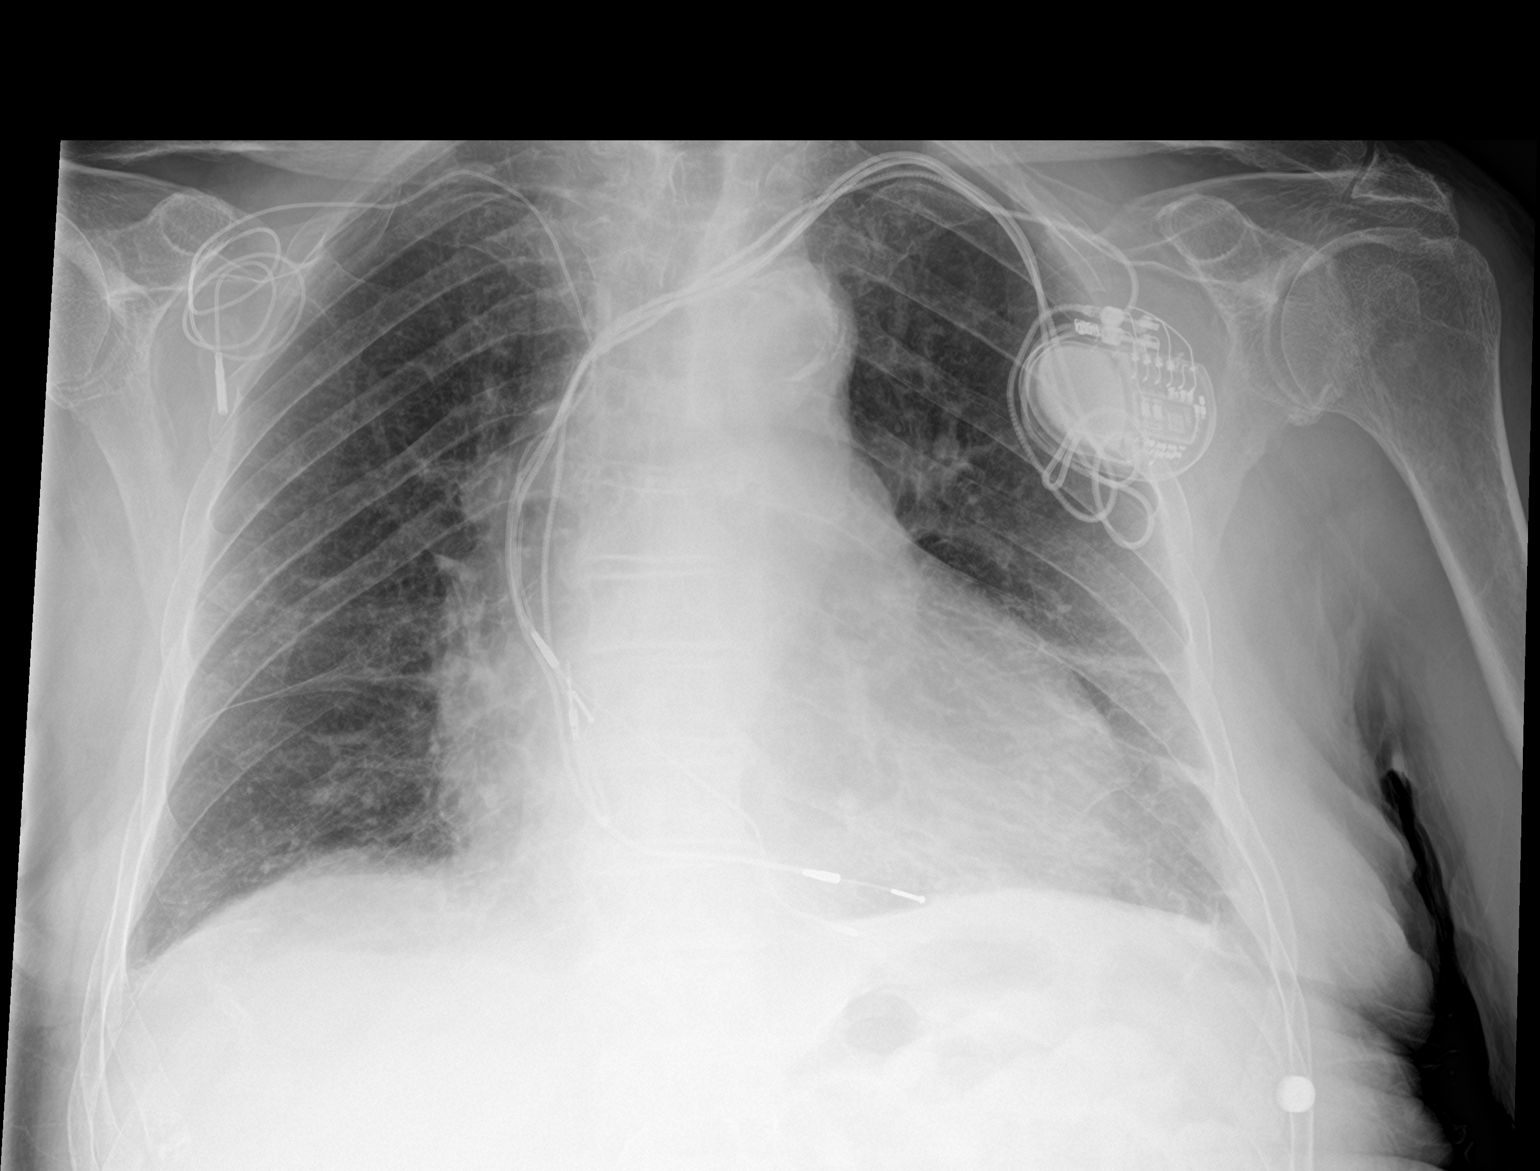

[2 of 2 positions shown; findings below may reference images not displayed]

FINDINGS: Stable appearance of pacemaker/ AICD.

The cardiac silhouette is enlarged. Mediastinal contours appear
intact.

There is no evidence of pneumothorax. Bilateral pleural effusions,
moderate. Streaky bibasilar airspace opacities.

Osseous structures are without acute abnormality. Soft tissues are
grossly normal.
IMPRESSION: Bilateral pleural effusions.

Bibasilar opacities may represent atelectasis, scarring or less
likely airspace consolidation.

Enlarged cardiac silhouette.

Calcific atherosclerotic disease of the aorta.

## 2018-09-07 DIAGNOSIS — B351 Tinea unguium: Secondary | ICD-10-CM | POA: Diagnosis not present

## 2018-09-07 DIAGNOSIS — M7741 Metatarsalgia, right foot: Secondary | ICD-10-CM | POA: Diagnosis not present

## 2018-09-07 DIAGNOSIS — L600 Ingrowing nail: Secondary | ICD-10-CM | POA: Diagnosis not present

## 2018-09-07 DIAGNOSIS — L84 Corns and callosities: Secondary | ICD-10-CM | POA: Diagnosis not present

## 2018-09-07 DIAGNOSIS — I70213 Atherosclerosis of native arteries of extremities with intermittent claudication, bilateral legs: Secondary | ICD-10-CM | POA: Diagnosis not present

## 2018-09-07 DIAGNOSIS — M2011 Hallux valgus (acquired), right foot: Secondary | ICD-10-CM | POA: Diagnosis not present

## 2018-09-21 DIAGNOSIS — I480 Paroxysmal atrial fibrillation: Secondary | ICD-10-CM | POA: Diagnosis not present

## 2018-09-21 DIAGNOSIS — I10 Essential (primary) hypertension: Secondary | ICD-10-CM | POA: Diagnosis not present

## 2018-09-21 DIAGNOSIS — Z950 Presence of cardiac pacemaker: Secondary | ICD-10-CM | POA: Diagnosis not present

## 2018-09-21 DIAGNOSIS — I495 Sick sinus syndrome: Secondary | ICD-10-CM | POA: Diagnosis not present

## 2018-09-26 DIAGNOSIS — N401 Enlarged prostate with lower urinary tract symptoms: Secondary | ICD-10-CM | POA: Diagnosis not present

## 2018-10-05 DIAGNOSIS — I495 Sick sinus syndrome: Secondary | ICD-10-CM | POA: Diagnosis not present

## 2018-11-23 DIAGNOSIS — I70213 Atherosclerosis of native arteries of extremities with intermittent claudication, bilateral legs: Secondary | ICD-10-CM | POA: Diagnosis not present

## 2018-11-23 DIAGNOSIS — L600 Ingrowing nail: Secondary | ICD-10-CM | POA: Diagnosis not present

## 2018-11-23 DIAGNOSIS — M2011 Hallux valgus (acquired), right foot: Secondary | ICD-10-CM | POA: Diagnosis not present

## 2018-11-23 DIAGNOSIS — L84 Corns and callosities: Secondary | ICD-10-CM | POA: Diagnosis not present

## 2018-11-23 DIAGNOSIS — M7741 Metatarsalgia, right foot: Secondary | ICD-10-CM | POA: Diagnosis not present

## 2018-11-23 DIAGNOSIS — B351 Tinea unguium: Secondary | ICD-10-CM | POA: Diagnosis not present

## 2018-12-21 DIAGNOSIS — I480 Paroxysmal atrial fibrillation: Secondary | ICD-10-CM | POA: Diagnosis not present

## 2018-12-21 DIAGNOSIS — I10 Essential (primary) hypertension: Secondary | ICD-10-CM | POA: Diagnosis not present

## 2018-12-21 DIAGNOSIS — I495 Sick sinus syndrome: Secondary | ICD-10-CM | POA: Diagnosis not present

## 2018-12-21 DIAGNOSIS — Z950 Presence of cardiac pacemaker: Secondary | ICD-10-CM | POA: Diagnosis not present

## 2019-02-01 DIAGNOSIS — M2011 Hallux valgus (acquired), right foot: Secondary | ICD-10-CM | POA: Diagnosis not present

## 2019-02-01 DIAGNOSIS — M7741 Metatarsalgia, right foot: Secondary | ICD-10-CM | POA: Diagnosis not present

## 2019-02-01 DIAGNOSIS — L84 Corns and callosities: Secondary | ICD-10-CM | POA: Diagnosis not present

## 2019-02-01 DIAGNOSIS — I70213 Atherosclerosis of native arteries of extremities with intermittent claudication, bilateral legs: Secondary | ICD-10-CM | POA: Diagnosis not present

## 2019-02-01 DIAGNOSIS — B351 Tinea unguium: Secondary | ICD-10-CM | POA: Diagnosis not present

## 2019-02-01 DIAGNOSIS — L600 Ingrowing nail: Secondary | ICD-10-CM | POA: Diagnosis not present

## 2019-03-15 DIAGNOSIS — L600 Ingrowing nail: Secondary | ICD-10-CM | POA: Diagnosis not present

## 2019-03-15 DIAGNOSIS — M2011 Hallux valgus (acquired), right foot: Secondary | ICD-10-CM | POA: Diagnosis not present

## 2019-03-15 DIAGNOSIS — I70213 Atherosclerosis of native arteries of extremities with intermittent claudication, bilateral legs: Secondary | ICD-10-CM | POA: Diagnosis not present

## 2019-03-15 DIAGNOSIS — M7741 Metatarsalgia, right foot: Secondary | ICD-10-CM | POA: Diagnosis not present

## 2019-03-15 DIAGNOSIS — B351 Tinea unguium: Secondary | ICD-10-CM | POA: Diagnosis not present

## 2019-03-15 DIAGNOSIS — L84 Corns and callosities: Secondary | ICD-10-CM | POA: Diagnosis not present

## 2019-03-22 DIAGNOSIS — Z950 Presence of cardiac pacemaker: Secondary | ICD-10-CM | POA: Diagnosis not present

## 2019-03-22 DIAGNOSIS — I10 Essential (primary) hypertension: Secondary | ICD-10-CM | POA: Diagnosis not present

## 2019-03-22 DIAGNOSIS — I495 Sick sinus syndrome: Secondary | ICD-10-CM | POA: Diagnosis not present

## 2019-03-22 DIAGNOSIS — I480 Paroxysmal atrial fibrillation: Secondary | ICD-10-CM | POA: Diagnosis not present

## 2019-04-03 DIAGNOSIS — I495 Sick sinus syndrome: Secondary | ICD-10-CM | POA: Diagnosis not present

## 2019-05-05 DIAGNOSIS — N5089 Other specified disorders of the male genital organs: Secondary | ICD-10-CM | POA: Diagnosis not present

## 2019-05-24 DIAGNOSIS — Z950 Presence of cardiac pacemaker: Secondary | ICD-10-CM | POA: Diagnosis not present

## 2019-05-24 DIAGNOSIS — I480 Paroxysmal atrial fibrillation: Secondary | ICD-10-CM | POA: Diagnosis not present

## 2019-05-24 DIAGNOSIS — I495 Sick sinus syndrome: Secondary | ICD-10-CM | POA: Diagnosis not present

## 2019-05-24 DIAGNOSIS — I10 Essential (primary) hypertension: Secondary | ICD-10-CM | POA: Diagnosis not present

## 2019-06-21 DIAGNOSIS — I480 Paroxysmal atrial fibrillation: Secondary | ICD-10-CM | POA: Diagnosis not present

## 2019-06-21 DIAGNOSIS — I10 Essential (primary) hypertension: Secondary | ICD-10-CM | POA: Diagnosis not present

## 2019-06-21 DIAGNOSIS — I495 Sick sinus syndrome: Secondary | ICD-10-CM | POA: Diagnosis not present

## 2019-06-21 DIAGNOSIS — Z950 Presence of cardiac pacemaker: Secondary | ICD-10-CM | POA: Diagnosis not present

## 2019-07-12 DIAGNOSIS — L84 Corns and callosities: Secondary | ICD-10-CM | POA: Diagnosis not present

## 2019-07-12 DIAGNOSIS — B351 Tinea unguium: Secondary | ICD-10-CM | POA: Diagnosis not present

## 2019-07-12 DIAGNOSIS — I70213 Atherosclerosis of native arteries of extremities with intermittent claudication, bilateral legs: Secondary | ICD-10-CM | POA: Diagnosis not present

## 2019-07-12 DIAGNOSIS — M7741 Metatarsalgia, right foot: Secondary | ICD-10-CM | POA: Diagnosis not present

## 2019-07-12 DIAGNOSIS — M2011 Hallux valgus (acquired), right foot: Secondary | ICD-10-CM | POA: Diagnosis not present

## 2019-09-20 DIAGNOSIS — M7741 Metatarsalgia, right foot: Secondary | ICD-10-CM | POA: Diagnosis not present

## 2019-09-20 DIAGNOSIS — B351 Tinea unguium: Secondary | ICD-10-CM | POA: Diagnosis not present

## 2019-09-20 DIAGNOSIS — L84 Corns and callosities: Secondary | ICD-10-CM | POA: Diagnosis not present

## 2019-09-20 DIAGNOSIS — M2011 Hallux valgus (acquired), right foot: Secondary | ICD-10-CM | POA: Diagnosis not present

## 2019-09-20 DIAGNOSIS — I70213 Atherosclerosis of native arteries of extremities with intermittent claudication, bilateral legs: Secondary | ICD-10-CM | POA: Diagnosis not present

## 2019-10-03 DIAGNOSIS — N401 Enlarged prostate with lower urinary tract symptoms: Secondary | ICD-10-CM | POA: Diagnosis not present

## 2019-11-20 DIAGNOSIS — I10 Essential (primary) hypertension: Secondary | ICD-10-CM | POA: Diagnosis not present

## 2019-11-20 DIAGNOSIS — Z950 Presence of cardiac pacemaker: Secondary | ICD-10-CM | POA: Diagnosis not present

## 2019-11-20 DIAGNOSIS — I495 Sick sinus syndrome: Secondary | ICD-10-CM | POA: Diagnosis not present

## 2019-11-20 DIAGNOSIS — I480 Paroxysmal atrial fibrillation: Secondary | ICD-10-CM | POA: Diagnosis not present

## 2019-11-27 DIAGNOSIS — I480 Paroxysmal atrial fibrillation: Secondary | ICD-10-CM | POA: Diagnosis not present

## 2019-11-27 DIAGNOSIS — I10 Essential (primary) hypertension: Secondary | ICD-10-CM | POA: Diagnosis not present

## 2019-11-27 DIAGNOSIS — Z950 Presence of cardiac pacemaker: Secondary | ICD-10-CM | POA: Diagnosis not present

## 2019-11-27 DIAGNOSIS — I495 Sick sinus syndrome: Secondary | ICD-10-CM | POA: Diagnosis not present

## 2019-12-20 DIAGNOSIS — B351 Tinea unguium: Secondary | ICD-10-CM | POA: Diagnosis not present

## 2019-12-20 DIAGNOSIS — M2011 Hallux valgus (acquired), right foot: Secondary | ICD-10-CM | POA: Diagnosis not present

## 2019-12-20 DIAGNOSIS — I70213 Atherosclerosis of native arteries of extremities with intermittent claudication, bilateral legs: Secondary | ICD-10-CM | POA: Diagnosis not present

## 2019-12-20 DIAGNOSIS — L84 Corns and callosities: Secondary | ICD-10-CM | POA: Diagnosis not present

## 2019-12-20 DIAGNOSIS — M7741 Metatarsalgia, right foot: Secondary | ICD-10-CM | POA: Diagnosis not present

## 2020-02-23 DIAGNOSIS — H401113 Primary open-angle glaucoma, right eye, severe stage: Secondary | ICD-10-CM | POA: Diagnosis not present

## 2020-02-26 DIAGNOSIS — I480 Paroxysmal atrial fibrillation: Secondary | ICD-10-CM | POA: Diagnosis not present

## 2020-02-26 DIAGNOSIS — I10 Essential (primary) hypertension: Secondary | ICD-10-CM | POA: Diagnosis not present

## 2020-02-26 DIAGNOSIS — I495 Sick sinus syndrome: Secondary | ICD-10-CM | POA: Diagnosis not present

## 2020-02-26 DIAGNOSIS — Z950 Presence of cardiac pacemaker: Secondary | ICD-10-CM | POA: Diagnosis not present

## 2020-03-13 DIAGNOSIS — I70213 Atherosclerosis of native arteries of extremities with intermittent claudication, bilateral legs: Secondary | ICD-10-CM | POA: Diagnosis not present

## 2020-03-13 DIAGNOSIS — L84 Corns and callosities: Secondary | ICD-10-CM | POA: Diagnosis not present

## 2020-03-13 DIAGNOSIS — M2011 Hallux valgus (acquired), right foot: Secondary | ICD-10-CM | POA: Diagnosis not present

## 2020-03-13 DIAGNOSIS — B351 Tinea unguium: Secondary | ICD-10-CM | POA: Diagnosis not present

## 2020-03-13 DIAGNOSIS — M7741 Metatarsalgia, right foot: Secondary | ICD-10-CM | POA: Diagnosis not present
